# Patient Record
Sex: Female | Born: 1945 | Race: White | Hispanic: No | Marital: Married | Smoking: Never smoker
Health system: Southern US, Community
[De-identification: ages and names within clinical notes are randomized; demographics above are authoritative.]

## PROBLEM LIST (undated history)

## (undated) DIAGNOSIS — F419 Anxiety disorder, unspecified: Secondary | ICD-10-CM

## (undated) DIAGNOSIS — Z853 Personal history of malignant neoplasm of breast: Secondary | ICD-10-CM

## (undated) DIAGNOSIS — N952 Postmenopausal atrophic vaginitis: Secondary | ICD-10-CM

## (undated) DIAGNOSIS — E7800 Pure hypercholesterolemia, unspecified: Secondary | ICD-10-CM

## (undated) DIAGNOSIS — K589 Irritable bowel syndrome without diarrhea: Secondary | ICD-10-CM

## (undated) DIAGNOSIS — L299 Pruritus, unspecified: Secondary | ICD-10-CM

## (undated) DIAGNOSIS — J349 Unspecified disorder of nose and nasal sinuses: Secondary | ICD-10-CM

## (undated) DIAGNOSIS — I10 Essential (primary) hypertension: Secondary | ICD-10-CM

## (undated) DIAGNOSIS — C50919 Malignant neoplasm of unspecified site of unspecified female breast: Secondary | ICD-10-CM

## (undated) DIAGNOSIS — M8580 Other specified disorders of bone density and structure, unspecified site: Secondary | ICD-10-CM

## (undated) DIAGNOSIS — H53009 Unspecified amblyopia, unspecified eye: Secondary | ICD-10-CM

## (undated) DIAGNOSIS — Z803 Family history of malignant neoplasm of breast: Secondary | ICD-10-CM

## (undated) DIAGNOSIS — K219 Gastro-esophageal reflux disease without esophagitis: Secondary | ICD-10-CM

## (undated) DIAGNOSIS — Z808 Family history of malignant neoplasm of other organs or systems: Secondary | ICD-10-CM

## (undated) HISTORY — PX: OTHER SURGICAL HISTORY: SHX169

## (undated) HISTORY — DX: Other specified disorders of bone density and structure, unspecified site: M85.80

## (undated) HISTORY — PX: TONSILLECTOMY: SUR1361

## (undated) HISTORY — DX: Family history of malignant neoplasm of other organs or systems: Z80.8

## (undated) HISTORY — DX: Gastro-esophageal reflux disease without esophagitis: K21.9

## (undated) HISTORY — DX: Malignant neoplasm of unspecified site of unspecified female breast: C50.919

## (undated) HISTORY — PX: BREAST SURGERY: SHX581

## (undated) HISTORY — DX: Unspecified disorder of nose and nasal sinuses: J34.9

## (undated) HISTORY — DX: Anxiety disorder, unspecified: F41.9

## (undated) HISTORY — DX: Essential (primary) hypertension: I10

## (undated) HISTORY — DX: Unspecified amblyopia, unspecified eye: H53.009

## (undated) HISTORY — DX: Family history of malignant neoplasm of breast: Z80.3

## (undated) HISTORY — DX: Pure hypercholesterolemia, unspecified: E78.00

## (undated) HISTORY — DX: Irritable bowel syndrome, unspecified: K58.9

## (undated) HISTORY — DX: Postmenopausal atrophic vaginitis: N95.2

## (undated) HISTORY — PX: VAGINAL HYSTERECTOMY: SUR661

## (undated) HISTORY — PX: EYE SURGERY: SHX253

## (undated) HISTORY — DX: Personal history of malignant neoplasm of breast: Z85.3

## (undated) HISTORY — DX: Pruritus, unspecified: L29.9

---

## 1983-01-11 DIAGNOSIS — Z853 Personal history of malignant neoplasm of breast: Secondary | ICD-10-CM

## 1983-01-11 HISTORY — DX: Personal history of malignant neoplasm of breast: Z85.3

## 1997-07-04 ENCOUNTER — Other Ambulatory Visit: Admission: RE | Admit: 1997-07-04 | Discharge: 1997-07-04 | Payer: Self-pay | Admitting: Obstetrics and Gynecology

## 1999-10-19 ENCOUNTER — Other Ambulatory Visit: Admission: RE | Admit: 1999-10-19 | Discharge: 1999-10-19 | Payer: Self-pay | Admitting: Gynecology

## 2001-03-16 ENCOUNTER — Other Ambulatory Visit: Admission: RE | Admit: 2001-03-16 | Discharge: 2001-03-16 | Payer: Self-pay | Admitting: Gynecology

## 2002-12-31 ENCOUNTER — Other Ambulatory Visit: Admission: RE | Admit: 2002-12-31 | Discharge: 2002-12-31 | Payer: Self-pay | Admitting: Gynecology

## 2004-01-06 ENCOUNTER — Other Ambulatory Visit: Admission: RE | Admit: 2004-01-06 | Discharge: 2004-01-06 | Payer: Self-pay | Admitting: Gynecology

## 2005-03-10 ENCOUNTER — Other Ambulatory Visit: Admission: RE | Admit: 2005-03-10 | Discharge: 2005-03-10 | Payer: Self-pay | Admitting: Gynecology

## 2007-05-31 ENCOUNTER — Other Ambulatory Visit: Admission: RE | Admit: 2007-05-31 | Discharge: 2007-05-31 | Payer: Self-pay | Admitting: Gynecology

## 2007-11-22 ENCOUNTER — Ambulatory Visit: Payer: Self-pay | Admitting: Women's Health

## 2007-11-22 ENCOUNTER — Encounter: Payer: Self-pay | Admitting: Women's Health

## 2007-11-22 ENCOUNTER — Other Ambulatory Visit: Admission: RE | Admit: 2007-11-22 | Discharge: 2007-11-22 | Payer: Self-pay | Admitting: Gynecology

## 2007-12-24 ENCOUNTER — Ambulatory Visit: Payer: Self-pay | Admitting: Women's Health

## 2008-06-13 ENCOUNTER — Encounter: Payer: Self-pay | Admitting: Women's Health

## 2008-06-13 ENCOUNTER — Ambulatory Visit: Payer: Self-pay | Admitting: Women's Health

## 2008-06-13 ENCOUNTER — Other Ambulatory Visit: Admission: RE | Admit: 2008-06-13 | Discharge: 2008-06-13 | Payer: Self-pay | Admitting: Gynecology

## 2008-07-31 ENCOUNTER — Ambulatory Visit: Payer: Self-pay | Admitting: Women's Health

## 2008-10-02 ENCOUNTER — Ambulatory Visit: Payer: Self-pay | Admitting: Women's Health

## 2008-11-07 ENCOUNTER — Ambulatory Visit: Payer: Self-pay | Admitting: Women's Health

## 2009-01-14 ENCOUNTER — Encounter: Admission: RE | Admit: 2009-01-14 | Discharge: 2009-01-14 | Payer: Self-pay | Admitting: Gastroenterology

## 2009-02-23 ENCOUNTER — Ambulatory Visit: Payer: Self-pay | Admitting: Women's Health

## 2009-07-24 ENCOUNTER — Ambulatory Visit: Payer: Self-pay | Admitting: Women's Health

## 2009-09-17 ENCOUNTER — Ambulatory Visit: Payer: Self-pay | Admitting: Women's Health

## 2009-09-17 ENCOUNTER — Other Ambulatory Visit: Admission: RE | Admit: 2009-09-17 | Discharge: 2009-09-17 | Payer: Self-pay | Admitting: Gynecology

## 2009-11-16 ENCOUNTER — Ambulatory Visit: Payer: Self-pay | Admitting: Women's Health

## 2009-11-30 ENCOUNTER — Ambulatory Visit: Payer: Self-pay | Admitting: Women's Health

## 2009-12-01 ENCOUNTER — Ambulatory Visit: Payer: Self-pay | Admitting: Women's Health

## 2010-03-04 ENCOUNTER — Ambulatory Visit (INDEPENDENT_AMBULATORY_CARE_PROVIDER_SITE_OTHER): Payer: BC Managed Care – PPO | Admitting: Women's Health

## 2010-03-04 DIAGNOSIS — B3731 Acute candidiasis of vulva and vagina: Secondary | ICD-10-CM

## 2010-03-04 DIAGNOSIS — N390 Urinary tract infection, site not specified: Secondary | ICD-10-CM

## 2010-03-04 DIAGNOSIS — B373 Candidiasis of vulva and vagina: Secondary | ICD-10-CM

## 2010-03-04 DIAGNOSIS — R82998 Other abnormal findings in urine: Secondary | ICD-10-CM

## 2010-03-31 ENCOUNTER — Ambulatory Visit (INDEPENDENT_AMBULATORY_CARE_PROVIDER_SITE_OTHER): Payer: BC Managed Care – PPO | Admitting: Women's Health

## 2010-03-31 DIAGNOSIS — R350 Frequency of micturition: Secondary | ICD-10-CM

## 2010-03-31 DIAGNOSIS — N898 Other specified noninflammatory disorders of vagina: Secondary | ICD-10-CM

## 2010-03-31 DIAGNOSIS — B373 Candidiasis of vulva and vagina: Secondary | ICD-10-CM

## 2010-03-31 DIAGNOSIS — R823 Hemoglobinuria: Secondary | ICD-10-CM

## 2010-08-23 ENCOUNTER — Other Ambulatory Visit: Payer: Self-pay | Admitting: Women's Health

## 2010-09-21 ENCOUNTER — Encounter: Payer: Self-pay | Admitting: Anesthesiology

## 2010-09-21 DIAGNOSIS — K589 Irritable bowel syndrome without diarrhea: Secondary | ICD-10-CM | POA: Insufficient documentation

## 2010-09-24 ENCOUNTER — Encounter: Payer: Self-pay | Admitting: Women's Health

## 2010-09-24 ENCOUNTER — Ambulatory Visit (INDEPENDENT_AMBULATORY_CARE_PROVIDER_SITE_OTHER): Payer: BC Managed Care – PPO | Admitting: Women's Health

## 2010-09-24 ENCOUNTER — Other Ambulatory Visit (HOSPITAL_COMMUNITY)
Admission: RE | Admit: 2010-09-24 | Discharge: 2010-09-24 | Disposition: A | Discharge status: Home / Self Care | Payer: BC Managed Care – PPO | Source: Ambulatory Visit | Attending: Women's Health | Admitting: Women's Health

## 2010-09-24 VITALS — BP 124/74 | Ht 61.0 in | Wt 131.0 lb

## 2010-09-24 DIAGNOSIS — Z01419 Encounter for gynecological examination (general) (routine) without abnormal findings: Secondary | ICD-10-CM

## 2010-09-24 DIAGNOSIS — Z780 Asymptomatic menopausal state: Secondary | ICD-10-CM

## 2010-09-24 DIAGNOSIS — R82998 Other abnormal findings in urine: Secondary | ICD-10-CM

## 2010-09-24 NOTE — Progress Notes (Signed)
Tracey Norris 1945/10/14 [id number]    History:    The patient presents for annual exam.  Realtor,  problems with head itching, flaking and scalp soreness was seen by a dermatologist 3 times without much relief..   Past medical history, past surgical history, family history and social history were all reviewed and documented in the EPIC chart.   ROS:  A  ROS was performed and pertinent positives and negatives are included in the history.  Exam:  Filed Vitals:   09/24/10 0939  BP: 124/74    General appearance:  Normal Head/Neck:  Normal, without cervical or supraclavicular adenopathy. Thyroid:  Symmetrical, normal in size, without palpable masses or nodularity. Respiratory  Effort:  Normal  Auscultation:  Clear without wheezing or rhonchi Cardiovascular  Auscultation:  Regular rate, without rubs, murmurs or gallops  Edema/varicosities:  Not grossly evident Abdominal  Soft,nontender, without masses, guarding or rebound.  Liver/spleen:  No organomegaly noted  Hernia:  None appreciated  Skin  Inspection:  Grossly normal  Palpation:  Grossly normal Neurologic/psychiatric  Orientation:  Normal with appropriate conversation.  Mood/affect:  Normal  Genitourinary    Breasts: Examined lying and sitting.     Right: Reduction Without masses, retractions, discharge or axillary adenopathy.     Left: Mastectomy/implant   Inguinal/mons:  Normal without inguinal adenopathy  External genitalia:  Normal  BUS/Urethra/Skene's glands:  Normal  Bladder:  Normal  Vagina:  Normal  Cervix:  absent  Uterus:  absent  Adnexa/parametria:     Rt: Without masses or tenderness.   Lt: Without masses or tenderness.  Anus and perineum: Normal  Digital rectal exam: Normal sphincter tone without palpated masses or tenderness  Assessment/Plan:  65 y.o.MWF G2P2  for annual exam/ TVH for irregular menses and menorrhagia.  History of the left mastectomy (85) with reconstruction(07). History of  hypertension, anxiety, primary care does medications and labs. She had a normal bone density in 08, and normal colonoscopy in 2011. She has had zostovac vaccine.  Plan: UA, Pap, SBEs, yearly mammogram, exercise, calcium rich diet, vitamin D 1000 daily, continue using mild shampoo, followup with dermatologist for scalp issue as needed. Schedule a DEXA here in the office. Fall prevention reviewed, will get a flu vaccine at her primary care. Has had problems with recurrent UTIs and yeast, but has done better this past year.    [facility], 12:57 PM 09/24/2010

## 2011-01-07 ENCOUNTER — Other Ambulatory Visit: Payer: Self-pay | Admitting: *Deleted

## 2011-01-07 DIAGNOSIS — Z853 Personal history of malignant neoplasm of breast: Secondary | ICD-10-CM

## 2011-01-21 ENCOUNTER — Other Ambulatory Visit: Payer: Self-pay | Admitting: Women's Health

## 2011-01-21 ENCOUNTER — Encounter: Payer: Self-pay | Admitting: Women's Health

## 2011-01-21 ENCOUNTER — Ambulatory Visit (INDEPENDENT_AMBULATORY_CARE_PROVIDER_SITE_OTHER): Payer: BC Managed Care – PPO | Admitting: Women's Health

## 2011-01-21 DIAGNOSIS — M545 Low back pain: Secondary | ICD-10-CM

## 2011-01-21 DIAGNOSIS — N899 Noninflammatory disorder of vagina, unspecified: Secondary | ICD-10-CM

## 2011-01-21 DIAGNOSIS — N898 Other specified noninflammatory disorders of vagina: Secondary | ICD-10-CM

## 2011-01-21 LAB — URINALYSIS W MICROSCOPIC + REFLEX CULTURE
Crystals: NONE SEEN
Glucose, UA: NEGATIVE mg/dL
Nitrite: NEGATIVE
Specific Gravity, Urine: 1.01 (ref 1.005–1.030)

## 2011-01-21 LAB — WET PREP FOR TRICH, YEAST, CLUE
Clue Cells Wet Prep HPF POC: NONE SEEN
Yeast Wet Prep HPF POC: NONE SEEN

## 2011-01-21 NOTE — Progress Notes (Signed)
Patient ID: Tracey Norris, female   DOB: 1945/01/15, 66 y.o.   MRN: [id number] Presents with a complaint of bladder pressure and urgency. Has been on an antibiotic for a skin issue and questions if she has yeast. Has had problems with recurrent yeast and UTIs. Denies fever, pain or burning with urination. History of a TVH in 54. UA: Small leukocytes, 3-6 WBCs.  Exam: No CVAT, abdomen soft nontender, slight tenderness at suprapubic area. External genitalia within normal limits without erythema. Speculum exam vaginal walls pink without discharge or odor. Wet prep negative. Bimanual no adnexal fullness or tenderness.   Probable UTI  Plan: Urine culture pending. Cipro 250 by mouth twice a day for 7 days prescription given. Will take if symptoms increase or change over the weekend, will call for culture results on Monday. Aware of UTI and yeast prevention.

## 2011-01-23 LAB — URINE CULTURE: Colony Count: 75000

## 2011-01-24 ENCOUNTER — Telehealth: Payer: Self-pay | Admitting: Women's Health

## 2011-01-24 DIAGNOSIS — N390 Urinary tract infection, site not specified: Secondary | ICD-10-CM

## 2011-01-24 NOTE — Telephone Encounter (Signed)
Telephone call, informed urine culture positive for Lactobacillus. Reviewed it is not a common finding in urine cultures. Taking large amounts of probiotics for IBS symptoms. Did review decreasing the amount of probiotics. States does feel like she has a UTI. Will take Cipro 250 twice a day for 3 days. Instructed to call if still symptomatic and will check a test of cure in 2 weeks.Marland Kitchen

## 2011-01-26 ENCOUNTER — Encounter: Payer: Self-pay | Admitting: *Deleted

## 2011-01-26 NOTE — Progress Notes (Signed)
Patient ID: Tracey Norris, female   DOB: 1945-09-04, 66 y.o.   MRN: [id number] Pt called and left message about urine culture, spoke with pt and said that she spoke with nancy already,

## 2011-01-28 ENCOUNTER — Other Ambulatory Visit: Payer: Self-pay | Admitting: Women's Health

## 2011-01-28 DIAGNOSIS — Z853 Personal history of malignant neoplasm of breast: Secondary | ICD-10-CM

## 2011-02-03 ENCOUNTER — Telehealth: Payer: Self-pay | Admitting: *Deleted

## 2011-02-03 NOTE — Telephone Encounter (Signed)
Telephone call to review problem. Took 3 days of Cipro, urine culture was negative. Has had problems with recurrent UTIs and yeast. States has slight leg aching and backache. Will watch at this time and return to office in several days if no relief for a UA and wet prep. Denies fever.

## 2011-02-03 NOTE — Telephone Encounter (Signed)
Pt was seen in office on 01/21/11 back pain and leg cramps. She was given cipro 250 mg 7 day and told to take only half of the 7 day dose. Pt did so, now she feel sick again, feeling nausea, having leg cramps again. She mentioned that you did want to recheck a U/A once medication finish. Pt would like to know if you want her to come in to recheck for yeast or drop U/A off? Please advise

## 2011-02-07 ENCOUNTER — Ambulatory Visit (INDEPENDENT_AMBULATORY_CARE_PROVIDER_SITE_OTHER): Payer: BC Managed Care – PPO | Admitting: Women's Health

## 2011-02-07 ENCOUNTER — Encounter: Payer: Self-pay | Admitting: Women's Health

## 2011-02-07 DIAGNOSIS — I10 Essential (primary) hypertension: Secondary | ICD-10-CM | POA: Insufficient documentation

## 2011-02-07 DIAGNOSIS — N898 Other specified noninflammatory disorders of vagina: Secondary | ICD-10-CM

## 2011-02-07 DIAGNOSIS — R300 Dysuria: Secondary | ICD-10-CM

## 2011-02-07 LAB — URINALYSIS W MICROSCOPIC + REFLEX CULTURE
Bilirubin Urine: NEGATIVE
Crystals: NONE SEEN
Glucose, UA: NEGATIVE mg/dL
Nitrite: NEGATIVE
Protein, ur: NEGATIVE mg/dL

## 2011-02-07 LAB — WET PREP FOR TRICH, YEAST, CLUE
Clue Cells Wet Prep HPF POC: NONE SEEN
Trich, Wet Prep: NONE SEEN

## 2011-02-07 NOTE — Progress Notes (Signed)
Patient ID: Tracey Norris, female   DOB: [birth year], 66 y.o.   MRN: [id number] Presents with complaint of low back ache, achy sensation in her legs and abdomen, frequency, scant clear discharge. States has similar symptoms when she has had a UTI or yeast infection. History of recurrent UTIs and yeast. Denies any fever. Had a negative urine culture 2 weeks ago. History of a TVH.  Exam: CVAT, abdomen was soft with no rebound or radiation of pain or tenderness in the suprapubic area. External genitalia is slightly erythematous. Speculum exam scant erythema, scant discharge no odor was present. Wet prep completely negative. Bimanual no adnexal fullness or tenderness. UA 0-2 WBCs.  When: Urine culture pending, triage based on the results. Aware of yeast prevention. Has Diflucan 100 at home willl try 1 tablet. Tylenol when necessary for discomfort.

## 2011-02-09 LAB — URINE CULTURE
Colony Count: NO GROWTH
Organism ID, Bacteria: NO GROWTH

## 2011-04-05 ENCOUNTER — Encounter: Payer: Self-pay | Admitting: Gynecology

## 2011-04-05 ENCOUNTER — Ambulatory Visit (INDEPENDENT_AMBULATORY_CARE_PROVIDER_SITE_OTHER): Payer: BC Managed Care – PPO | Admitting: Gynecology

## 2011-04-05 ENCOUNTER — Other Ambulatory Visit: Payer: Self-pay | Admitting: Gynecology

## 2011-04-05 ENCOUNTER — Telehealth: Payer: Self-pay | Admitting: *Deleted

## 2011-04-05 VITALS — BP 146/88

## 2011-04-05 DIAGNOSIS — N952 Postmenopausal atrophic vaginitis: Secondary | ICD-10-CM

## 2011-04-05 DIAGNOSIS — R350 Frequency of micturition: Secondary | ICD-10-CM

## 2011-04-05 DIAGNOSIS — N898 Other specified noninflammatory disorders of vagina: Secondary | ICD-10-CM

## 2011-04-05 LAB — URINALYSIS W MICROSCOPIC + REFLEX CULTURE
Bilirubin Urine: NEGATIVE
Crystals: NONE SEEN
Ketones, ur: NEGATIVE mg/dL

## 2011-04-05 LAB — WET PREP FOR TRICH, YEAST, CLUE
Trich, Wet Prep: NONE SEEN
Yeast Wet Prep HPF POC: NONE SEEN

## 2011-04-05 NOTE — Telephone Encounter (Signed)
Per JF  pt will need OV, pt transferred to appointment desk to schedule.

## 2011-04-05 NOTE — Progress Notes (Signed)
Patient is a 66 year old who presented to the office today stating that she's going frequently to urinate but feel that she does not empty completely. She denies any dysuria some slight irritation in the vagina and that her urine had an odor. Review patient's records indicated that she has been treated in the past for urinary tract infections. She is on Vagifem 10 mcg intravaginal twice a week. She had been counseled before this was initiated due to the fact that in 1985 she had left breast cancer. Patient denies any fever chills.  Exam: Bartholin urethra Skene glands: Atrophic changes Vagina: Atrophic changes and no vaginal discharge or lesions seen Bimanual: No palpable masses or tenderness Rectal: Not examined  Vaginal wet prep today CBC few bacteria  Urinalysis 3-6 WBC few bacteria culture pending  Assessment/plan: Due to patient's past history and similar symptoms similar to prior urinary tract infection we'll initiate antibiotic prior to result of her culture. She will be placed on Cipro 250 mg twice a day for 5 days. She was instructed to increase her fluid intake and to report to the office or the emergency room in the event of flank pain and/or fever. If this continues to be an ongoing issue she may need to be referred to the urologist for further evaluation.

## 2011-04-05 NOTE — Telephone Encounter (Signed)
Pt called c/o possible UTI. symptoms are aching legs and strong urine smell, frequent urination  x 2 days. Pt said this is normal for her to have recurrent UTI. No fever nor other complaints. Pt would like to drop off u/a, she cant make OV due to work. Please advise

## 2011-04-05 NOTE — Patient Instructions (Signed)
Prescription in pharmacy. You can call Monday for result of culture. Office closed Friday  Urinary Tract Infection Infections of the urinary tract can start in several places. A bladder infection (cystitis), a kidney infection (pyelonephritis), and a prostate infection (prostatitis) are different types of urinary tract infections (UTIs). They usually get better if treated with medicines (antibiotics) that kill germs. Take all the medicine until it is gone. You or your child may feel better in a few days, but TAKE ALL MEDICINE or the infection may not respond and may become more difficult to treat. HOME CARE INSTRUCTIONS   Drink enough water and fluids to keep the urine clear or pale yellow. Cranberry juice is especially recommended, in addition to large amounts of water.   Avoid caffeine, tea, and carbonated beverages. They tend to irritate the bladder.   Alcohol may irritate the prostate.   Only take over-the-counter or prescription medicines for pain, discomfort, or fever as directed by your caregiver.  To prevent further infections:  Empty the bladder often. Avoid holding urine for long periods of time.   After a bowel movement, women should cleanse from front to back. Use each tissue only once.   Empty the bladder before and after sexual intercourse.  FINDING OUT THE RESULTS OF YOUR TEST Not all test results are available during your visit. If your or your child's test results are not back during the visit, make an appointment with your caregiver to find out the results. Do not assume everything is normal if you have not heard from your caregiver or the medical facility. It is important for you to follow up on all test results. SEEK MEDICAL CARE IF:   There is back pain.   Your baby is older than 3 months with a rectal temperature of 100.5 F (38.1 C) or higher for more than 1 day.   Your or your child's problems (symptoms) are no better in 3 days. Return sooner if you or your child  is getting worse.  SEEK IMMEDIATE MEDICAL CARE IF:   There is severe back pain or lower abdominal pain.   You or your child develops chills.   You have a fever.   Your baby is older than 3 months with a rectal temperature of 102 F (38.9 C) or higher.   Your baby is 63 months old or younger with a rectal temperature of 100.4 F (38 C) or higher.   There is nausea or vomiting.   There is continued burning or discomfort with urination.  MAKE SURE YOU:   Understand these instructions.   Will watch your condition.   Will get help right away if you are not doing well or get worse.  Document Released: 10/06/2004 Document Revised: 12/16/2010 Document Reviewed: 05/11/2006 [facility] Patient Information 2012 [place], [place].

## 2011-04-06 ENCOUNTER — Telehealth: Payer: Self-pay | Admitting: *Deleted

## 2011-04-06 MED ORDER — CIPROFLOXACIN HCL 250 MG PO TABS: 250.0000 mg | ORAL_TABLET | Freq: Two times a day (BID) | ORAL | Status: AC

## 2011-04-06 NOTE — Telephone Encounter (Signed)
Pt called stating pharmacy never got rx for cipro 250 mg twice a day for 5 days. rx sent to pharmacy. Pt informed.

## 2011-04-07 ENCOUNTER — Telehealth: Payer: Self-pay | Admitting: *Deleted

## 2011-04-07 LAB — URINE CULTURE: Organism ID, Bacteria: NO GROWTH

## 2011-04-07 NOTE — Telephone Encounter (Signed)
Pt saw JF for uti on 04/05/11. Pt would like to speak with you about her urine culture results. Please advise

## 2011-04-07 NOTE — Telephone Encounter (Signed)
Telephone call reviewed urine culture negative. Will finish out 3 days of cipro, symptoms better.

## 2011-04-27 ENCOUNTER — Ambulatory Visit (INDEPENDENT_AMBULATORY_CARE_PROVIDER_SITE_OTHER): Payer: BC Managed Care – PPO | Admitting: Women's Health

## 2011-04-27 ENCOUNTER — Encounter: Payer: Self-pay | Admitting: Women's Health

## 2011-04-27 DIAGNOSIS — C50919 Malignant neoplasm of unspecified site of unspecified female breast: Secondary | ICD-10-CM

## 2011-04-27 DIAGNOSIS — N898 Other specified noninflammatory disorders of vagina: Secondary | ICD-10-CM

## 2011-04-27 DIAGNOSIS — C50912 Malignant neoplasm of unspecified site of left female breast: Secondary | ICD-10-CM | POA: Insufficient documentation

## 2011-04-27 DIAGNOSIS — L293 Anogenital pruritus, unspecified: Secondary | ICD-10-CM

## 2011-04-27 DIAGNOSIS — R300 Dysuria: Secondary | ICD-10-CM

## 2011-04-27 LAB — URINALYSIS W MICROSCOPIC + REFLEX CULTURE
Casts: NONE SEEN
Crystals: NONE SEEN
Glucose, UA: NEGATIVE mg/dL
Ketones, ur: NEGATIVE mg/dL
Nitrite: NEGATIVE

## 2011-04-27 LAB — WET PREP FOR TRICH, YEAST, CLUE: Trich, Wet Prep: NONE SEEN

## 2011-04-27 NOTE — Progress Notes (Signed)
Patient ID: Akshaya Toepfer, female   DOB: [birth year], 66 y.o.   MRN: [id number] Presents with slight vaginal burning with occasional itching and bladder pressure for several weeks. Denies a fever, discharge, change in urinary frequency or pain at the end of the stream. TVH.  Has had problems with increased anxiety partially related to scalp burning/discomfort which has been a problem for almost one year. Has followup today with dermatologist.  Exam: No CVAT, abdomen soft, no rebound or radiation of pain. External genitalia slight erythema at introitus, speculum exam scant white discharge, wet prep negative, UA trace leukocytes, 0 to 2 WBCs.  Vaginal burning/bladder pressure History of recurrent yeast  Plan: Urine culture pending, if urine culture negative, bladder pressure continues, ultrasound to rule out ovarian issue. Diflucan 200 by mouth x1 dose.

## 2011-07-11 IMAGING — US US ABDOMEN COMPLETE
1 series · 13 of 25 positions shown · non-contrast
Comparison: None.

CLINICAL DATA: Abdominal pain, nausea, some diarrhea

COMPLETE ABDOMINAL ULTRASOUND

[Series 1: us abdomen complete · 0.31mm/px · 13 of 65 slices shown]
[im 1/65]
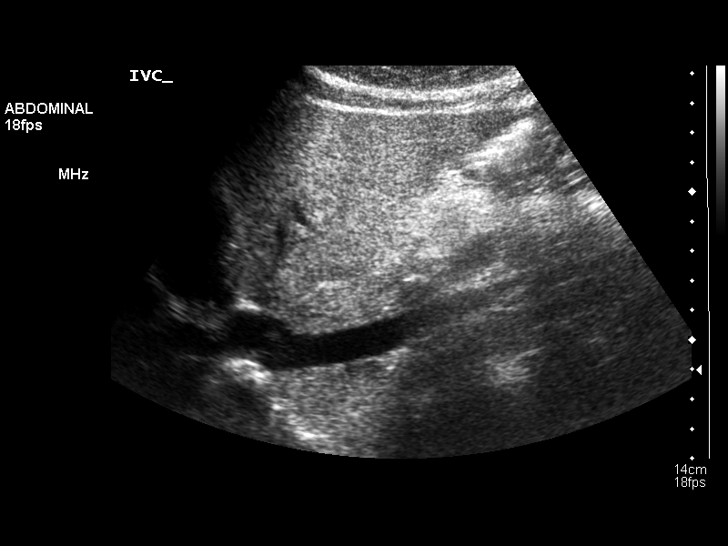
[im 6/65]
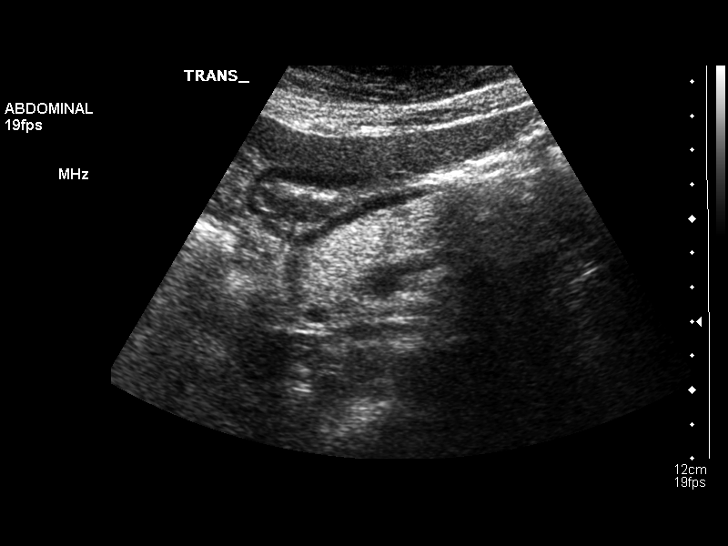
[im 11/65]
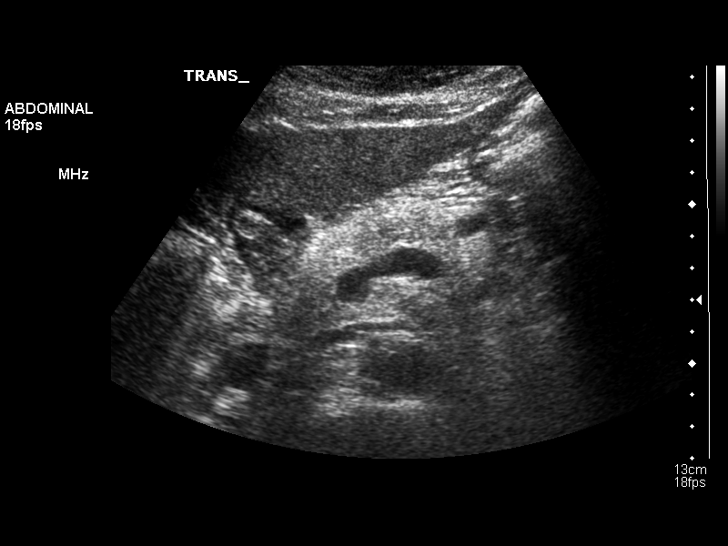
[im 17/65]
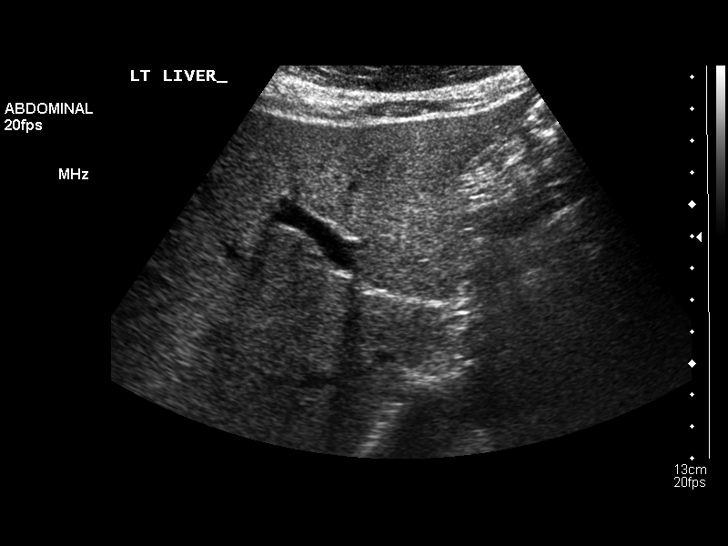
[im 22/65]
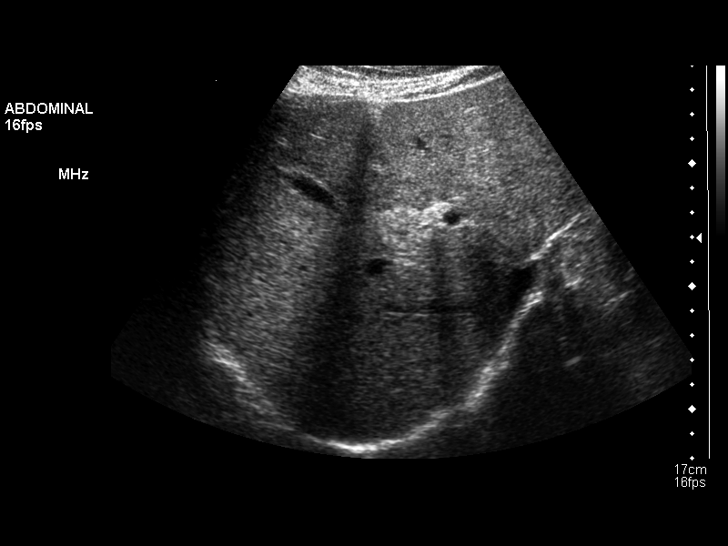
[im 27/65]
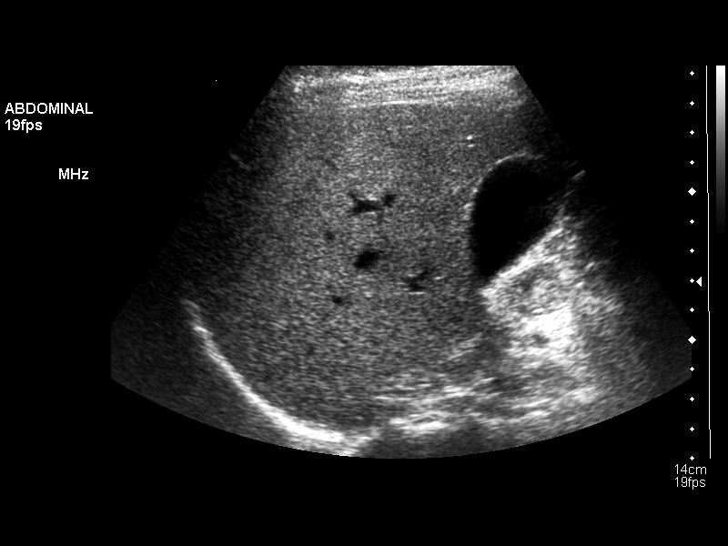
[im 33/65]
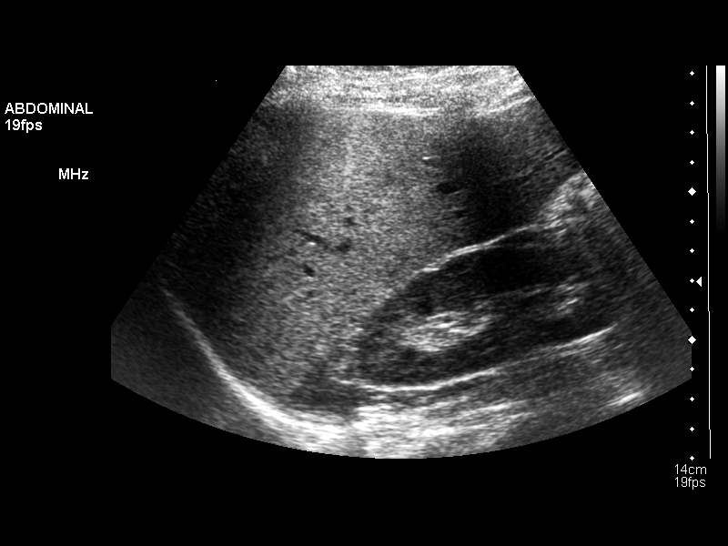
[im 38/65]
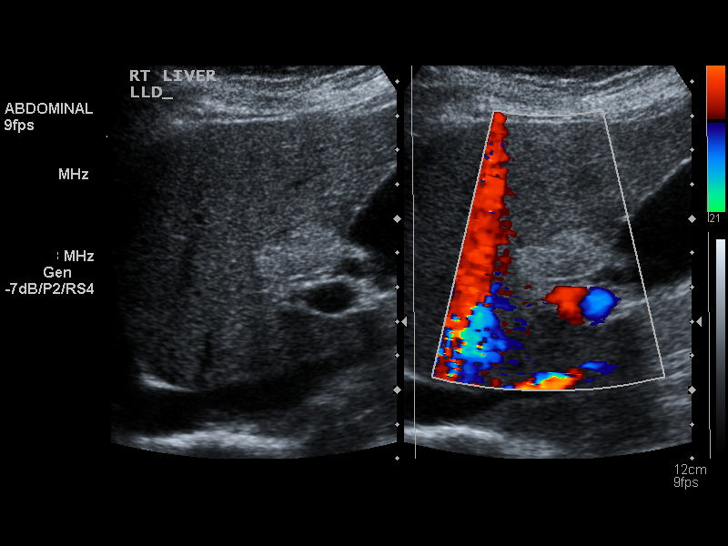
[im 43/65]
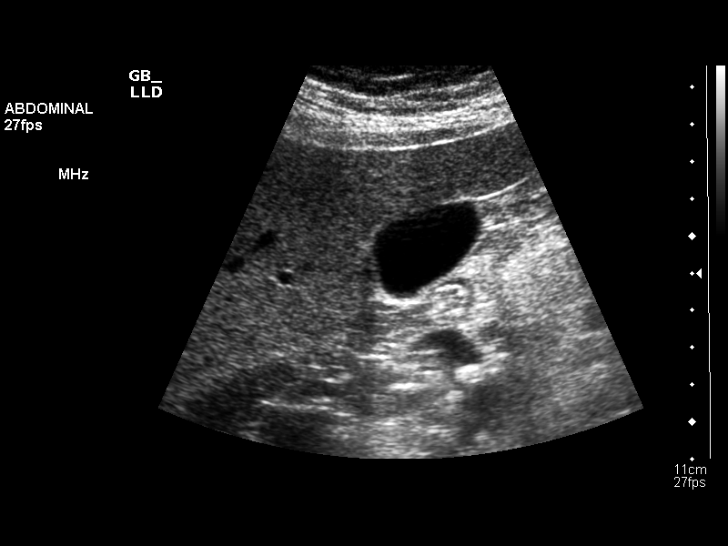
[im 49/65]
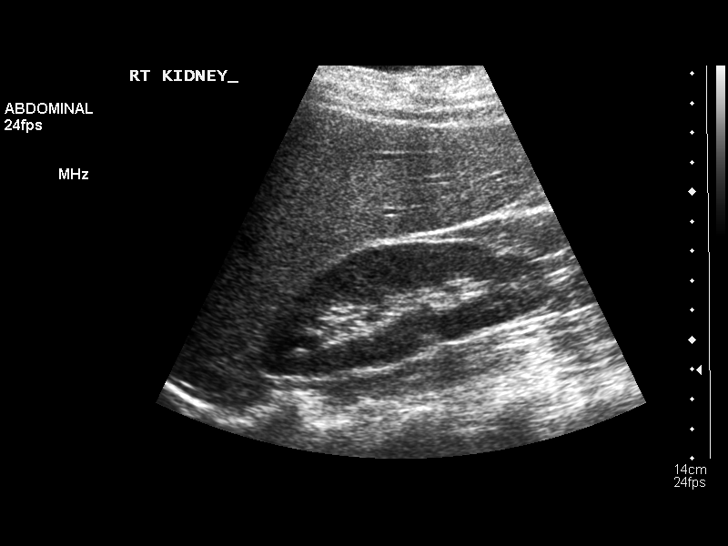
[im 54/65]
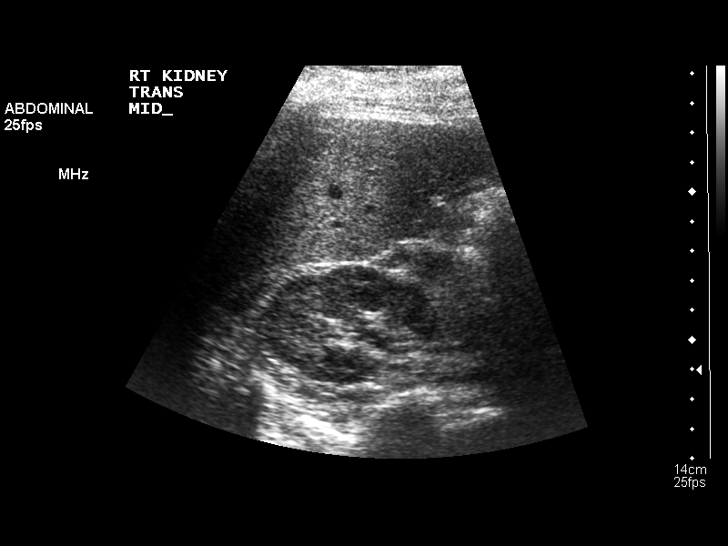
[im 59/65]
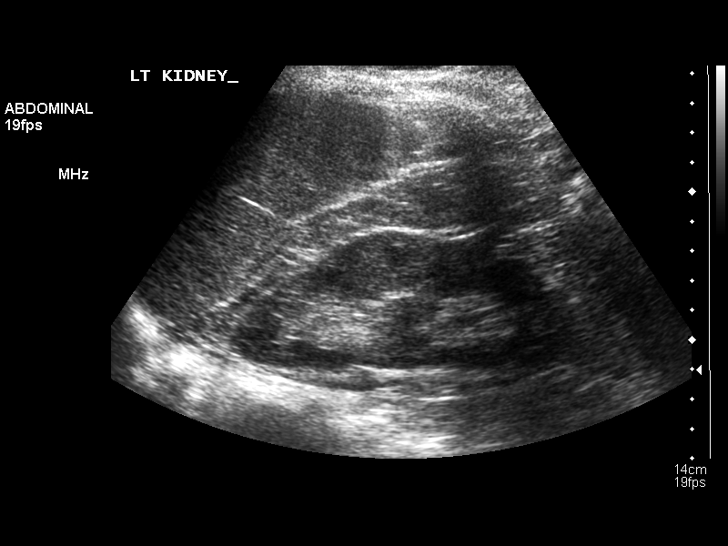
[im 65/65]
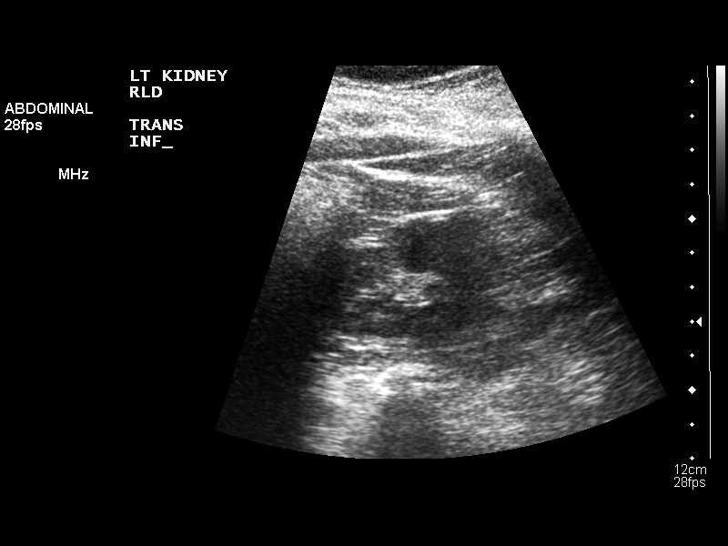

[13 of 25 positions shown; findings below may reference images not displayed]

FINDINGS: Gallbladder:  The gallbladder is well seen and no gallstones are
noted

Common bile duct:  The common bile duct is within normal limits in
size measuring 5.8 mm in diameter.

Liver:  The liver is slightly echogenic suggesting fatty
infiltration.  However, there is a focal area of hyper echogenicity
near the porta hepatis.  This could represent focal fat or possibly
hemangioma measuring 2.7 x 1.4 x 2.6 cm.  Either follow-up
ultrasound or CT abdomen with IV contrast may be helpful to assess
further if warranted clinically.

IVC:  Appears normal.

Pancreas:  No focal abnormality seen.

Spleen:  The spleen is normal measuring 6.5 cm sagittally with
small accessory spleen present.

Right Kidney:  No hydronephrosis is seen.  The right kidney
measures 10.0 cm sagittally.

Left Kidney:  No hydronephrosis.  The left kidney measures 12.1 cm
pre

Abdominal aorta:  The abdominal aorta is normal in caliber.
IMPRESSION: 1.  Probable fatty infiltration of the liver with focal fat versus
hemangioma near the porta hepatis as described above.  If further
assessment is warranted consider either follow-up ultrasound or CT
abdomen with contrast.
2.  No gallstones.  No ductal dilatation.

## 2011-08-02 ENCOUNTER — Telehealth: Payer: Self-pay | Admitting: *Deleted

## 2011-08-02 MED ORDER — ESTRADIOL 10 MCG VA TABS: 10.0000 ug | ORAL_TABLET | VAGINAL | Status: DC

## 2011-08-02 NOTE — Telephone Encounter (Signed)
Pt called requesting rx for vagifem 10 mcg. Pt old rx had expired, rx sent.

## 2011-09-07 ENCOUNTER — Ambulatory Visit (INDEPENDENT_AMBULATORY_CARE_PROVIDER_SITE_OTHER): Payer: BC Managed Care – PPO | Admitting: Women's Health

## 2011-09-07 ENCOUNTER — Encounter: Payer: Self-pay | Admitting: Women's Health

## 2011-09-07 VITALS — BP 130/82

## 2011-09-07 DIAGNOSIS — R300 Dysuria: Secondary | ICD-10-CM

## 2011-09-07 DIAGNOSIS — N898 Other specified noninflammatory disorders of vagina: Secondary | ICD-10-CM

## 2011-09-07 DIAGNOSIS — L293 Anogenital pruritus, unspecified: Secondary | ICD-10-CM

## 2011-09-07 DIAGNOSIS — N390 Urinary tract infection, site not specified: Secondary | ICD-10-CM

## 2011-09-07 LAB — URINALYSIS W MICROSCOPIC + REFLEX CULTURE
Bilirubin Urine: NEGATIVE
Crystals: NONE SEEN
Glucose, UA: NEGATIVE mg/dL
Ketones, ur: NEGATIVE mg/dL
Nitrite: NEGATIVE

## 2011-09-07 LAB — WET PREP FOR TRICH, YEAST, CLUE
Clue Cells Wet Prep HPF POC: NONE SEEN
Trich, Wet Prep: NONE SEEN
Yeast Wet Prep HPF POC: NONE SEEN

## 2011-09-07 MED ORDER — CIPROFLOXACIN HCL 500 MG PO TABS: 500.0000 mg | ORAL_TABLET | Freq: Two times a day (BID) | ORAL | Status: DC

## 2011-09-07 MED ORDER — CIPROFLOXACIN HCL 500 MG PO TABS: 500.0000 mg | ORAL_TABLET | Freq: Two times a day (BID) | ORAL | Status: AC

## 2011-09-07 NOTE — Progress Notes (Signed)
Patient ID: Tracey Norris, female   DOB: March 13, 1945, 66 y.o.   MRN: [id number] Presents with several complaints, having some left lateral hip discomfort, occasional vaginal burning, increased frequency and pressure with urination. Currently on probiotics which have helped prevent yeast. Denies a fever, but states has not felt well for several days. Has had problems with recurrent yeast and UTIs.  Exam: UA: 11-20 WBCs, few bacteria, small leukocytes. No CVAT, abdomen soft no rebound or radiation of pain, external genitalia within normal limits, speculum exam cervix pink, no discharge or erythema noted. Wet prep negative. Bimanual no CMT or adnexal fullness or tenderness.  UTI  Plan: Cipro 500 by mouth twice a day for 3 days. Urine culture pending. Continue with Vagifem twice weekly for vaginal dryness.

## 2011-09-07 NOTE — Patient Instructions (Addendum)

## 2011-09-09 LAB — URINE CULTURE

## 2011-09-26 ENCOUNTER — Encounter: Payer: Self-pay | Admitting: Women's Health

## 2011-09-26 ENCOUNTER — Ambulatory Visit (INDEPENDENT_AMBULATORY_CARE_PROVIDER_SITE_OTHER): Payer: BC Managed Care – PPO | Admitting: Women's Health

## 2011-09-26 VITALS — BP 126/82 | Ht 62.0 in | Wt 133.0 lb

## 2011-09-26 DIAGNOSIS — Z780 Asymptomatic menopausal state: Secondary | ICD-10-CM

## 2011-09-26 DIAGNOSIS — G4700 Insomnia, unspecified: Secondary | ICD-10-CM

## 2011-09-26 DIAGNOSIS — E7800 Pure hypercholesterolemia, unspecified: Secondary | ICD-10-CM

## 2011-09-26 DIAGNOSIS — Z01419 Encounter for gynecological examination (general) (routine) without abnormal findings: Secondary | ICD-10-CM

## 2011-09-26 MED ORDER — ALPRAZOLAM 0.25 MG PO TABS: 0.2500 mg | ORAL_TABLET | Freq: Every evening | ORAL | Status: DC | PRN

## 2011-09-26 NOTE — Patient Instructions (Signed)
heaHealth Recommendations for Postmenopausal Women Based on the Results of the Women's Health Initiative [facility]) and Other Studies The WHI is a major 15-year research program to address the most common causes of death, disability and poor quality of life in postmenopausal women. Some of these causes are heart disease, cancer, bone loss (osteoporosis) and others. Taking into account all of the findings from [facility] and other studies, here are bottom-line health recommendations for women: CARDIOVASCULAR DISEASE Heart Disease: A heart attack is a medical emergency. Know the signs and symptoms of a heart attack. Hormone therapy should not be used to prevent heart disease. In women with heart disease, hormone therapy should not be used to prevent further disease. Hormone therapy increases the risk of blood clots. Below are things women can do to reduce their risk for heart disease.   Do not smoke. If you smoke, quit. Women who smoke are 2 to 6 times more likely to suffer a heart attack than non-smoking women.   Aim for a healthy weight. Being overweight causes many preventable deaths. Eat a healthy and balanced diet and drink an adequate amount of liquids.   Get moving. Make a commitment to be more physically active. Aim for 30 minutes of activity on most, if not all days of the week.   Eat for heart health. Choose a diet that is low in saturated fat, trans fat, and cholesterol. Include whole grains, vegetables, and fruits. Read the labels on the food container before buying it.   Know your numbers. Ask your caregiver to check your blood pressure, cholesterol (total, HDL, LDL, triglycerides) and blood glucose. Work with your caregiver to improve any numbers that are not normal.   High blood pressure. Limit or stop your table salt intake (try salt substitute and food seasonings), avoid salty foods and drinks. Read the labels on the food container before buying it. Avoid becoming overweight by eating well and  exercising.  STROKE  Stroke is a medical emergency. Stroke can be the result of a blood clot in the blood vessel in the brain or by a brain hemorrhage (bleeding). Know the signs and symptoms of a stroke. To lower the risk of developing a stroke:  Avoid fatty foods.   Quit smoking.   Control your diabetes, blood pressure, and irregular heart rate.  THROMBOPHLIBITIS (BLOOD CLOT) OF THE LEG  Hormone treatment is a big cause of developing blood clots in the leg. Becoming overweight and leading a stationary lifestyle also may contribute to developing blood clots. Controlling your diet and exercising will help lower the risk of developing blood clots. CANCER SCREENING  Breast Cancer: Women should take steps to reduce their risk of breast cancer. This includes having regular mammograms, monthly self breast exams and regular breast exams by your caregiver. Have a mammogram every one to two years if you are 42 to 64 years old. Have a mammogram annually if you are 19 years old or older depending on your risk factors. Women who are high risk for breast cancer may need more frequent mammograms. There are tests available (testing the genes in your body) if you have family history of breast cancer called BRCA 1 and 2. These tests can help determine the risks of developing breast cancer.   Intestinal or Stomach Cancer: Women should talk to their caregiver about when to start screening, what tests and how often they should be done, and the benefits and risks of doing these tests. Tests to consider are a rectal exam, fecal  occult blood, sigmoidoscopy, colononoscoby, barium enema and upper GI series of the stomach. Depending on the age, you may want to get a medical and family history of colon cancer. Women who are high risk may need to be screened at an earlier age and more often.   Cervical Cancer: A Pap test of the cervix should be done every year and every 3 years when there has been three straight years of a  normal Pap test. Women with an abnormal Pap test should be screened more often or have a cervical biopsy depending on your caregiver's recommendation.   Uterine Cancer: If you have vaginal bleeding after you are in the menopause, it should be evaluated by your caregiver.   Ovarian cancer: There are no reliable tests available to screen for ovarian cancer at this time except for yearly pelvic exams.   Lung Cancer: Yearly chest X-rays can detect lung cancer and should be done on high risk women, such as cigarette smokers and women with chronic lung disease (emphysemia).   Skin Cancer: A complete body skin exam should be done at your yearly examination. Avoid overexposure to the sun and ultraviolet light lamps. Use a strong sun block cream when in the sun. All of these things are important in lowering the risk of skin cancer.  MENOPAUSE Menopause Symptoms: Hormone therapy products are effective for treating symptoms associated with menopause:  Moderate to severe hot flashes.   Night sweats.   Mood swings.   Headaches.   Tiredness.   Loss of sex drive.   Insomnia.   Other symptoms.  However, hormone therapy products carry serious risks, especially in older women. Women who use or are thinking about using estrogen or estrogen with progestin treatments should discuss that with their caregiver. Your caregiver will know if the benefits outweigh the risks. The Food and Drug Administration (FDA) has concluded that hormone therapy should be used only at the lowest doses and for the shortest amount of time to reach treatment goals. It is not known at what doses there may be less risk of serious side effects. There are other treatments such as herbal medication (not controlled or regulated by the FDA), group therapy, counseling and acupuncture that may be helpful. OSTEOPOROSIS Protecting Against Bone Loss and Preventing Fracture: If hormone therapy is used for prevention of bone loss (osteoporosis),  the risks for bone loss must outweigh the risk of the therapy. Women considering taking hormone therapy for bone loss should ask their health care providers about other medications (fosamax and boniva) that are considered safe and effective for preventing bone loss and bone fractures. To guard against bone loss or fractures, it is recommended that women should take at least 1000-1500 mg of calcium and 400-800 IU of vitamin D daily in divided doses. Smoking and excessive alcohol intake increases the risk of osteoporosis. Eat foods rich in calcium and vitamin D and do weight bearing exercises several times a week as your caregiver suggests. DIABETES Diabetes Melitus: Women with Type I or Type 2 diabetes should keep their diabetes in control with diet, exercise and medication. Avoid too many sweets, starchy and fatty foods. Being overweight can affect your diabetes. COGNITION AND MEMORY Cognition and Memory: Menopausal hormone therapy is not recommended for the prevention of cognitive disorders such as Alzheimer's disease or memory loss. WHI found that women treated with hormone therapy have a greater risk of developing dementia.  DEPRESSION  Depression may occur at any age, but is common in elderly women.   The reasons may be because of physical, medical, social (loneliness), financial and/or economic problems and needs. Becoming involved with church, volunteer or social groups, seeking treatment for any physical or medical problems is recommended. Also, look into getting professional advice for any economic or financial problems. ACCIDENTS  Accidents are common and can be serious in the elderly woman. Prepare your house to prevent accidents. Eliminate throw rugs, use hip protectors, place hand bars in the bath, shower and toilet areas. Avoid wearing high heel shoes and walking on wet, snowy and icy areas. Stop driving if you have vision, hearing problems or are unsteady with you movements and  reflexes. RHEUMATOID ARTHRITIS Rheumatoid arthritis causes pain, swelling and stiffness of your bone joints. It can limit many of your activities. Over-the-counter medications may help, but prescription medications may be necessary. Talk with your caregiver about this. Exercise (walking, water aerobics), good posture, using splints on painful joints, warm baths or applying warm compresses to stiff joints and cold compresses to painful joints may be helpful. Smoking and excessive drinking may worsen the symptoms of arthritis. Seek help from a physical therapist if the arthritis is becoming a problem with your daily activities. IMMUNIZATIONS  Several immunizations are important to have during your senior years, including:   Tetanus and a diptheria shot booster every 10 years.   Influenza every year before the flu season begins.   Pneumonia vaccine.   Shingles vaccine.   Others as indicated (example: H1N1 vaccine).  Document Released: 02/18/2005 Document Revised: 12/16/2010 Document Reviewed: 10/15/2007 [employer] Patient Information 2012 [employer]. 

## 2011-09-26 NOTE — Progress Notes (Addendum)
Tracey Norris 12-30-1945 [id number]   History:    The patient presents for annual exam.  Postmenopausal, TVH with ovarian conservation for DUB. Uses Vagifem twice weekly for vaginal dryness and itching. Has been on several years, approved by oncologist. Left breast cancer/ mastectomy with node dissection (negative) in 85, reconstructed with implant 07. Did not receive radiation, chemotherapy or tamoxifen.  Breast cancer was not estrogen dependent, has not had BRCA testing and is not sure if she wants to have done. History of hypertension, IBS and hypercholesterolemia treated by primary care. Has had an ongoing problem with a scalp issue, has seen numerous dermatologists. Has had problems with recurrent yeast and UTIs, better in the past year. Normal colonoscopy 2012, normal Paps and normal right breast mammograms. Osteopenic - Dexa right hip T score -1.2  2008.   Past medical history, past surgical history, family history and social history were all reviewed and documented in the EPIC chart. 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 Mother had history of diabetes and breast cancer at age 14, died at 69 heart related.   ROS:  A  ROS was performed and pertinent positives and negatives are included in the history.  Exam:  Filed Vitals:   09/26/11 1159  BP: 126/82    General appearance:  Normal Head/Neck:  Normal, without cervical or supraclavicular adenopathy. Thyroid:  Symmetrical, normal in size, without palpable masses or nodularity. Respiratory  Effort:  Normal  Auscultation:  Clear without wheezing or rhonchi Cardiovascular  Auscultation:  Regular rate, without rubs, murmurs or gallops  Edema/varicosities:  Not grossly evident Abdominal  Soft,nontender, without masses, guarding or rebound.  Liver/spleen:  No organomegaly noted  Hernia:  None appreciated  Skin  Inspection:  Grossly normal  Palpation:  Grossly normal Neurologic/psychiatric  Orientation:  Normal with appropriate  conversation.  Mood/affect:  Normal  Genitourinary    Breasts: Examined lying and sitting.     Right: Reduction/lift, no palpable nodules, no nipple discharge or axillary adenopathy.     Left: Mastectomy/implant    Inguinal/mons:  Normal without inguinal adenopathy  External genitalia:  Normal  BUS/Urethra/Skene's glands:  Normal  Bladder:  Normal  Vagina:  Normal  Cervix:  Absent  Uterus:  Absent  Adnexa/parametria:     Rt: Without masses or tenderness.   Lt: Without masses or tenderness.  Anus and perineum: Normal  Digital rectal exam: Normal sphincter tone without palpated masses or tenderness  Assessment/Plan:  66 y.o. M. WF G2 P2  for annual exam.     Postmenopausal TVH Vagifem twice weekly for dryness and itching with good relief Left breast cancer history/mastectomy/implant Hypertension, hypercholesterolemia, IBS-primary care labs and meds  Plan: Last DEXA 2008. instructed to schedule. Reviewed importance of exercise, home safety and fall prevention discussed. SBE's, continue annual mammogram, calcium rich diet, vitamin D 2000 daily encouraged. UA and home Hemoccult card given. No Pap history of normal Paps new screening guidelines reviewed. BRCA testing reviewed and declines states is not sure she wants. Continue care with primary care for medical problems. Vagifem twice weekly prescription, samples, proper use given and reviewed. Reviewed minimal absorption, slight risk for blood clots and strokes reviewed. Has had less problems with recurrent yeast when on. Encouraged lubricants for intercourse.    Harrington Challenger [facility], 1:43 PM 09/26/2011

## 2011-09-27 LAB — URINALYSIS W MICROSCOPIC + REFLEX CULTURE
Bacteria, UA: NONE SEEN
Hgb urine dipstick: NEGATIVE
Protein, ur: NEGATIVE mg/dL
pH: 6.5 (ref 5.0–8.0)

## 2012-04-30 ENCOUNTER — Telehealth: Payer: Self-pay | Admitting: *Deleted

## 2012-04-30 ENCOUNTER — Encounter: Payer: Self-pay | Admitting: *Deleted

## 2012-04-30 MED ORDER — ESTRADIOL 10 MCG VA TABS: 10.0000 ug | ORAL_TABLET | VAGINAL | Status: DC

## 2012-04-30 NOTE — Telephone Encounter (Signed)
Pt called requesting refill on vagifem 10 mcg per nancy note on 09/25/12. Pt takes medication, rx will be sent, annual due in September.

## 2012-07-03 ENCOUNTER — Other Ambulatory Visit: Payer: Self-pay | Admitting: *Deleted

## 2012-07-03 DIAGNOSIS — Z853 Personal history of malignant neoplasm of breast: Secondary | ICD-10-CM

## 2012-07-05 ENCOUNTER — Encounter: Payer: Self-pay | Admitting: Women's Health

## 2012-07-16 ENCOUNTER — Encounter: Payer: Self-pay | Admitting: Women's Health

## 2012-07-16 ENCOUNTER — Ambulatory Visit (INDEPENDENT_AMBULATORY_CARE_PROVIDER_SITE_OTHER): Payer: BC Managed Care – PPO | Admitting: Women's Health

## 2012-07-16 DIAGNOSIS — N898 Other specified noninflammatory disorders of vagina: Secondary | ICD-10-CM

## 2012-07-16 DIAGNOSIS — N899 Noninflammatory disorder of vagina, unspecified: Secondary | ICD-10-CM

## 2012-07-16 DIAGNOSIS — M545 Low back pain: Secondary | ICD-10-CM

## 2012-07-16 LAB — URINALYSIS W MICROSCOPIC + REFLEX CULTURE
Ketones, ur: NEGATIVE mg/dL
Nitrite: NEGATIVE
Protein, ur: NEGATIVE mg/dL
Urobilinogen, UA: 0.2 mg/dL (ref 0.0–1.0)
pH: 6.5 (ref 5.0–8.0)

## 2012-07-16 LAB — WET PREP FOR TRICH, YEAST, CLUE
Clue Cells Wet Prep HPF POC: NONE SEEN
Trich, Wet Prep: NONE SEEN
Yeast Wet Prep HPF POC: NONE SEEN

## 2012-07-16 MED ORDER — FLUCONAZOLE 100 MG PO TABS: ORAL_TABLET | ORAL | Status: DC

## 2012-07-16 NOTE — Progress Notes (Signed)
Patient ID: Tracey Norris, female   DOB: [birth year], 67 y.o.   MRN: [id number] Present with complaint of urinary frequency, vaginal irritation with minimal itching. Has used Diflucan 100x2 doses in the last week. Currently on minocycline for a scalp problem which has helped. Denies fever. History of IBS, some abdominal pain. TVH.  Exam: Appears well, UA: 0 - 2 WBCs,  trace leukocytes,  rare bacteria. External genitalia slightly erythematous at introitus, speculum exam no visible discharge, vaginal walls minimally irritated, wet prep negative. Bimanual nontender.  Normal exam  Plan: Continue Vagifem twice weekly which has helped vaginal itching, reviewed wet prep negative, Diflucan probably rectified yeast. UA culture pending. Continue followup with dermatologist for scalp problem.

## 2012-07-17 LAB — URINE CULTURE: Colony Count: NO GROWTH

## 2012-09-13 DIAGNOSIS — D510 Vitamin B12 deficiency anemia due to intrinsic factor deficiency: Secondary | ICD-10-CM | POA: Diagnosis not present

## 2012-09-18 DIAGNOSIS — L708 Other acne: Secondary | ICD-10-CM | POA: Diagnosis not present

## 2012-09-18 DIAGNOSIS — L981 Factitial dermatitis: Secondary | ICD-10-CM | POA: Diagnosis not present

## 2012-09-18 DIAGNOSIS — L219 Seborrheic dermatitis, unspecified: Secondary | ICD-10-CM | POA: Diagnosis not present

## 2012-09-24 DIAGNOSIS — IMO0002 Reserved for concepts with insufficient information to code with codable children: Secondary | ICD-10-CM | POA: Diagnosis not present

## 2012-09-24 DIAGNOSIS — J019 Acute sinusitis, unspecified: Secondary | ICD-10-CM | POA: Diagnosis not present

## 2012-10-04 DIAGNOSIS — I10 Essential (primary) hypertension: Secondary | ICD-10-CM | POA: Diagnosis not present

## 2012-10-04 DIAGNOSIS — L738 Other specified follicular disorders: Secondary | ICD-10-CM | POA: Diagnosis not present

## 2012-10-04 DIAGNOSIS — E785 Hyperlipidemia, unspecified: Secondary | ICD-10-CM | POA: Diagnosis not present

## 2012-10-04 DIAGNOSIS — E559 Vitamin D deficiency, unspecified: Secondary | ICD-10-CM | POA: Diagnosis not present

## 2012-10-04 DIAGNOSIS — IMO0002 Reserved for concepts with insufficient information to code with codable children: Secondary | ICD-10-CM | POA: Diagnosis not present

## 2012-10-04 DIAGNOSIS — D510 Vitamin B12 deficiency anemia due to intrinsic factor deficiency: Secondary | ICD-10-CM | POA: Diagnosis not present

## 2012-10-04 DIAGNOSIS — Z23 Encounter for immunization: Secondary | ICD-10-CM | POA: Diagnosis not present

## 2012-10-04 DIAGNOSIS — L678 Other hair color and hair shaft abnormalities: Secondary | ICD-10-CM | POA: Diagnosis not present

## 2012-10-22 DIAGNOSIS — E785 Hyperlipidemia, unspecified: Secondary | ICD-10-CM | POA: Diagnosis not present

## 2012-10-22 DIAGNOSIS — E559 Vitamin D deficiency, unspecified: Secondary | ICD-10-CM | POA: Diagnosis not present

## 2012-10-22 DIAGNOSIS — J329 Chronic sinusitis, unspecified: Secondary | ICD-10-CM | POA: Diagnosis not present

## 2012-10-22 DIAGNOSIS — IMO0002 Reserved for concepts with insufficient information to code with codable children: Secondary | ICD-10-CM | POA: Diagnosis not present

## 2012-10-22 DIAGNOSIS — D510 Vitamin B12 deficiency anemia due to intrinsic factor deficiency: Secondary | ICD-10-CM | POA: Diagnosis not present

## 2012-10-31 DIAGNOSIS — R252 Cramp and spasm: Secondary | ICD-10-CM | POA: Diagnosis not present

## 2012-10-31 DIAGNOSIS — M21969 Unspecified acquired deformity of unspecified lower leg: Secondary | ICD-10-CM | POA: Diagnosis not present

## 2012-10-31 DIAGNOSIS — R52 Pain, unspecified: Secondary | ICD-10-CM | POA: Diagnosis not present

## 2012-11-01 ENCOUNTER — Ambulatory Visit (INDEPENDENT_AMBULATORY_CARE_PROVIDER_SITE_OTHER): Payer: Medicare Other | Admitting: Women's Health

## 2012-11-01 ENCOUNTER — Encounter: Payer: Self-pay | Admitting: Women's Health

## 2012-11-01 VITALS — BP 128/76 | Ht 61.25 in | Wt 132.0 lb

## 2012-11-01 DIAGNOSIS — N899 Noninflammatory disorder of vagina, unspecified: Secondary | ICD-10-CM | POA: Diagnosis not present

## 2012-11-01 DIAGNOSIS — N898 Other specified noninflammatory disorders of vagina: Secondary | ICD-10-CM

## 2012-11-01 MED ORDER — FLUCONAZOLE 100 MG PO TABS: ORAL_TABLET | ORAL | Status: DC

## 2012-11-01 NOTE — Patient Instructions (Signed)
pnuemonia vaccine Health Recommendations for Postmenopausal Women Respected and ongoing research has looked at the most common causes of death, disability, and poor quality of life in postmenopausal women. The causes include heart disease, diseases of blood vessels, diabetes, depression, cancer, and bone loss (osteoporosis). Many things can be done to help lower the chances of developing these and other common problems: CARDIOVASCULAR DISEASE Heart Disease: A heart attack is a medical emergency. Know the signs and symptoms of a heart attack. Below are things women can do to reduce their risk for heart disease.   Do not smoke. If you smoke, quit.  Aim for a healthy weight. Being overweight causes many preventable deaths. Eat a healthy and balanced diet and drink an adequate amount of liquids.  Get moving. Make a commitment to be more physically active. Aim for 30 minutes of activity on most, if not all days of the week.  Eat for heart health. Choose a diet that is low in saturated fat and cholesterol and eliminate trans fat. Include whole grains, vegetables, and fruits. Read and understand the labels on food containers before buying.  Know your numbers. Ask your caregiver to check your blood pressure, cholesterol (total, HDL, LDL, triglycerides) and blood glucose. Work with your caregiver on improving your entire clinical picture.  High blood pressure. Limit or stop your table salt intake (try salt substitute and food seasonings). Avoid salty foods and drinks. Read labels on food containers before buying. Eating well and exercising can help control high blood pressure. STROKE  Stroke is a medical emergency. Stroke may be the result of a blood clot in a blood vessel in the brain or by a brain hemorrhage (bleeding). Know the signs and symptoms of a stroke. To lower the risk of developing a stroke:  Avoid fatty foods.  Quit smoking.  Control your diabetes, blood pressure, and irregular heart  rate. THROMBOPHLEBITIS (BLOOD CLOT) OF THE LEG  Becoming overweight and leading a stationary lifestyle may also contribute to developing blood clots. Controlling your diet and exercising will help lower the risk of developing blood clots. CANCER SCREENING  Breast Cancer: Take steps to reduce your risk of breast cancer.  You should practice "breast self-awareness." This means understanding the normal appearance and feel of your breasts and should include breast self-examination. Any changes detected, no matter how small, should be reported to your caregiver.  After age 27, you should have a clinical breast exam (CBE) every year.  Starting at age 48, you should consider having a mammogram (breast X-ray) every year.  If you have a family history of breast cancer, talk to your caregiver about genetic screening.  If you are at high risk for breast cancer, talk to your caregiver about having an MRI and a mammogram every year.  Intestinal or Stomach Cancer: Tests to consider are a rectal exam, fecal occult blood, sigmoidoscopy, and colonoscopy. Women who are high risk may need to be screened at an earlier age and more often.  Cervical Cancer:  Beginning at age 87, you should have a Pap test every 3 years as long as the past 3 Pap tests have been normal.  If you have had past treatment for cervical cancer or a condition that could lead to cancer, you need Pap tests and screening for cancer for at least 20 years after your treatment.  If you had a hysterectomy for a problem that was not cancer or a condition that could lead to cancer, then you no longer need Pap  tests.  If you are between ages 47 and 74, and you have had normal Pap tests going back 10 years, you no longer need Pap tests.  If Pap tests have been discontinued, risk factors (such as a new sexual partner) need to be reassessed to determine if screening should be resumed.  Some medical problems can increase the chance of getting  cervical cancer. In these cases, your caregiver may recommend more frequent screening and Pap tests.  Uterine Cancer: If you have vaginal bleeding after reaching menopause, you should notify your caregiver.  Ovarian cancer: Other than yearly pelvic exams, there are no reliable tests available to screen for ovarian cancer at this time except for yearly pelvic exams.  Lung Cancer: Yearly chest X-rays can detect lung cancer and should be done on high risk women, such as cigarette smokers and women with chronic lung disease (emphysema).  Skin Cancer: A complete body skin exam should be done at your yearly examination. Avoid overexposure to the sun and ultraviolet light lamps. Use a strong sun block cream when in the sun. All of these things are important in lowering the risk of skin cancer. MENOPAUSE Menopause Symptoms: Hormone therapy products are effective for treating symptoms associated with menopause:  Moderate to severe hot flashes.  Night sweats.  Mood swings.  Headaches.  Tiredness.  Loss of sex drive.  Insomnia.  Other symptoms. Hormone replacement carries certain risks, especially in older women. Women who use or are thinking about using estrogen or estrogen with progestin treatments should discuss that with their caregiver. Your caregiver will help you understand the benefits and risks. The ideal dose of hormone replacement therapy is not known. The Food and Drug Administration (FDA) has concluded that hormone therapy should be used only at the lowest doses and for the shortest amount of time to reach treatment goals.  OSTEOPOROSIS Protecting Against Bone Loss and Preventing Fracture: If you use hormone therapy for prevention of bone loss (osteoporosis), the risks for bone loss must outweigh the risk of the therapy. Ask your caregiver about other medications known to be safe and effective for preventing bone loss and fractures. To guard against bone loss or fractures, the  following is recommended:  If you are less than age 79, take 1000 mg of calcium and at least 600 mg of Vitamin D per day.  If you are greater than age 40 but less than age 61, take 1200 mg of calcium and at least 600 mg of Vitamin D per day.  If you are greater than age 55, take 1200 mg of calcium and at least 800 mg of Vitamin D per day. Smoking and excessive alcohol intake increases the risk of osteoporosis. Eat foods rich in calcium and vitamin D and do weight bearing exercises several times a week as your caregiver suggests. DIABETES Diabetes Melitus: If you have Type I or Type 2 diabetes, you should keep your blood sugar under control with diet, exercise and recommended medication. Avoid too many sweets, starchy and fatty foods. Being overweight can make control more difficult. COGNITION AND MEMORY Cognition and Memory: Menopausal hormone therapy is not recommended for the prevention of cognitive disorders such as Alzheimer's disease or memory loss.  DEPRESSION  Depression may occur at any age, but is common in elderly women. The reasons may be because of physical, medical, social (loneliness), or financial problems and needs. If you are experiencing depression because of medical problems and control of symptoms, talk to your caregiver about this. Physical  activity and exercise may help with mood and sleep. Community and volunteer involvement may help your sense of value and worth. If you have depression and you feel that the problem is getting worse or becoming severe, talk to your caregiver about treatment options that are best for you. ACCIDENTS  Accidents are common and can be serious in the elderly woman. Prepare your house to prevent accidents. Eliminate throw rugs, place hand bars in the bath, shower and toilet areas. Avoid wearing high heeled shoes or walking on wet, snowy, and icy areas. Limit or stop driving if you have vision or hearing problems, or you feel you are unsteady with you  movements and reflexes. HEPATITIS C Hepatitis C is a type of viral infection affecting the liver. It is spread mainly through contact with blood from an infected person. It can be treated, but if left untreated, it can lead to severe liver damage over years. Many people who are infected do not know that the virus is in their blood. If you are a "baby-boomer", it is recommended that you have one screening test for Hepatitis C. IMMUNIZATIONS  Several immunizations are important to consider having during your senior years, including:   Tetanus, diptheria, and pertussis booster shot.  Influenza every year before the flu season begins.  Pneumonia vaccine.  Shingles vaccine.  Others as indicated based on your specific needs. Talk to your caregiver about these. Document Released: 02/18/2005 Document Revised: 12/14/2011 Document Reviewed: 10/15/2007 [facility] Patient Information 2014 [place], [place].

## 2012-11-01 NOTE — Progress Notes (Signed)
Tracey Norris 20-May-1945 [id number]    History:    The patient presents for breast and pelvic exam. 1985 - Left breast mastectomy -ER, reconstruction 2007. Negative colonoscopy 2012. TVH for DUB using Vagifem twice weekly for vaginal dryness/burning with good relief of symptoms. History of recurrent UTIs and yeast, less frequent this past year. Hypertension/hypercholesterolemia/IBS management by primary care. Problems with a scalp bacterial infection on long-term medication with some relief per Dermatologist. Currently seeing a podiatrist for foot pain and poor circulation. 2008 DEXA T score -1.2 at right femoral neck, hip average -0.3. Normal Pap history.  Past medical history, past surgical history, family history and social history were all reviewed and documented in the EPIC chart. 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 2 children both doing well. Mother breast cancer age 37 died from  MI at 22.  Exam:  Filed Vitals:   11/01/12 1218  BP: 128/76    General appearance:  Normal Head/Neck:  Normal, without cervical or supraclavicular adenopathy. Thyroid:  Symmetrical, normal in size, without palpable masses or nodularity. Respiratory  Effort:  Normal  Auscultation:  Clear without wheezing or rhonchi Cardiovascular  Auscultation:  Regular rate, without rubs, murmurs or gallops  Edema/varicosities:  Not grossly evident Abdominal  Soft,nontender, without masses, guarding or rebound.  Liver/spleen:  No organomegaly noted  Hernia:  None appreciated  Skin  Inspection:  Grossly normal  Palpation:  Grossly normal Neurologic/psychiatric  Orientation:  Normal with appropriate conversation.  Mood/affect:  Normal  Genitourinary    Breasts: Examined lying and sitting.     Right: Without masses, retractions, discharge or axillary adenopathy/reduction.     Left: Mastectomy with implant   Inguinal/mons:  Normal without inguinal adenopathy  External genitalia:   Normal  BUS/Urethra/Skene's glands:  Normal  Bladder:  Normal  Vagina:  Normal  Cervix:  Absent  Uterus:  Absent  Adnexa/parametria:     Rt: Without masses or tenderness.   Lt: Without masses or tenderness.  Anus and perineum: Normal  Digital rectal exam: Normal sphincter tone without palpated masses or tenderness  Assessment/Plan:  67 y.o.  MWF G2P39for breast and pelvic exam.  TVH for DUB Vagifem 1985 Lt breast cancer/ mastectomy/reconstructed Hypertension/hypercholesterolemia/IBS-primary care labs and meds Bacterial infection of scalp-dermatologist Osteopenia  Plan: Instructed to return to primary care for Pneumovax. SBE's, continue annual mammogram, 3-D tomography reviewed and encouraged. Vagifem prescription, one per vagina one to 2 times weekly, proper use, samples given and reviewed slight risk for blood clots, strokes, breast cancer. Repeat DEXA. Instructed to schedule. Home safety, fall prevention and importance of regular exercise reviewed. Vitamin D 2000 daily encouraged.   Harrington Challenger [facility], 12:58 PM 11/01/2012

## 2012-11-05 DIAGNOSIS — R252 Cramp and spasm: Secondary | ICD-10-CM | POA: Diagnosis not present

## 2012-11-05 DIAGNOSIS — G2581 Restless legs syndrome: Secondary | ICD-10-CM | POA: Diagnosis not present

## 2012-11-05 DIAGNOSIS — IMO0001 Reserved for inherently not codable concepts without codable children: Secondary | ICD-10-CM | POA: Diagnosis not present

## 2012-11-09 DIAGNOSIS — L981 Factitial dermatitis: Secondary | ICD-10-CM | POA: Diagnosis not present

## 2012-11-09 DIAGNOSIS — L708 Other acne: Secondary | ICD-10-CM | POA: Diagnosis not present

## 2012-11-12 DIAGNOSIS — J310 Chronic rhinitis: Secondary | ICD-10-CM | POA: Diagnosis not present

## 2012-11-15 DIAGNOSIS — H2510 Age-related nuclear cataract, unspecified eye: Secondary | ICD-10-CM | POA: Diagnosis not present

## 2012-11-15 DIAGNOSIS — H43819 Vitreous degeneration, unspecified eye: Secondary | ICD-10-CM | POA: Diagnosis not present

## 2012-11-22 DIAGNOSIS — L981 Factitial dermatitis: Secondary | ICD-10-CM | POA: Diagnosis not present

## 2012-11-22 DIAGNOSIS — L219 Seborrheic dermatitis, unspecified: Secondary | ICD-10-CM | POA: Diagnosis not present

## 2012-12-04 DIAGNOSIS — R040 Epistaxis: Secondary | ICD-10-CM | POA: Diagnosis not present

## 2012-12-07 DIAGNOSIS — D510 Vitamin B12 deficiency anemia due to intrinsic factor deficiency: Secondary | ICD-10-CM | POA: Diagnosis not present

## 2012-12-10 ENCOUNTER — Ambulatory Visit: Payer: Medicare Other | Admitting: Podiatry

## 2012-12-11 ENCOUNTER — Ambulatory Visit (INDEPENDENT_AMBULATORY_CARE_PROVIDER_SITE_OTHER): Payer: Medicare Other

## 2012-12-11 ENCOUNTER — Encounter: Payer: Self-pay | Admitting: Podiatrist

## 2012-12-11 ENCOUNTER — Ambulatory Visit (INDEPENDENT_AMBULATORY_CARE_PROVIDER_SITE_OTHER): Payer: Medicare Other | Admitting: Podiatrist

## 2012-12-11 VITALS — BP 145/82 | HR 61 | Resp 18

## 2012-12-11 DIAGNOSIS — M216X9 Other acquired deformities of unspecified foot: Secondary | ICD-10-CM | POA: Diagnosis not present

## 2012-12-11 DIAGNOSIS — R252 Cramp and spasm: Secondary | ICD-10-CM

## 2012-12-11 DIAGNOSIS — M79609 Pain in unspecified limb: Secondary | ICD-10-CM | POA: Diagnosis not present

## 2012-12-11 DIAGNOSIS — M722 Plantar fascial fibromatosis: Secondary | ICD-10-CM

## 2012-12-11 NOTE — Progress Notes (Signed)
   Subjective:    Patient ID: Tracey Norris, female    DOB: Jul 06, 1945, 67 y.o.   MRN: [id number]  HPI patient presents today for generalized foot pain bilateral. She states that the left foot is larger than the right, she has high arches, she has pain on the tops of bilateral feet, and her feet tingle and cramp and she can only wear certain shoes. She also states she was on antibiotics for over 30 days for some scalp issues and she's concerned that the antibiotics may have caused her feet to hurt and cramp. She has tried shoes from the shoe market, big deal shoes, and easy Spirits.  Overall she has difficulty getting comfortable in shoes   Review of Systems  Constitutional: Negative.   HENT: Positive for sinus pressure.   Eyes: Positive for itching.  Respiratory: Negative.   Cardiovascular:       Leg pain when walking  Gastrointestinal: Negative.   Endocrine: Negative.   Genitourinary: Negative.   Musculoskeletal:       Muscle pain  Skin: Negative.   Allergic/Immunologic: Negative.   Neurological: Negative.   Hematological: Negative.   Psychiatric/Behavioral: Negative.        Objective:   Physical Exam GENERAL APPEARANCE: Alert, conversant. Appropriately groomed. No acute distress.  VASCULAR: Pedal pulses palpable and strong bilateral.  Capillary refill time is immediate to all digits,  Proximal to distal cooling it warm to warm.  Digital hair growth is present bilateral  NEUROLOGIC: sensation is intact epicritically and protectively to 5.07 monofilament at 5/5 sites bilateral.  Light touch is intact bilateral, vibratory sensation intact bilateral, achilles tendon reflex is intact bilateral.  MUSCULOSKELETAL: acceptable muscle strength, tone and stability bilateral.  Intrinsic muscluature intact bilateral.  High arched foot type is noted bilateral. Prominent navicular and cuneiforms on the medial aspect of the dorsal foot also noted bilateral. No bony exostosis  present.  DERMATOLOGIC: Fat pad atrophy is noted on the plantar aspect of bilateral feet. Slight decrease in skin color, texture, and turgor is noted bilateral. Discomfort on the dorsal aspect of bilateral feet at the naviculocuneiform region is also present.    Assessment & Plan:  Cavus foot type bilateral, fat pad atrophy bilateral  Plan: Discussed shoes, inserts, and padding. Her main issue is trying to find a shoe that is comfortable and I recommended inserts. She tried on some power step inserts in the office and found they were uncomfortable in the arch on the left foot. I then recommended custom inserts however I did caution her that these will push up on her arch and may cause discomfort on the top of her foot in certain shoes. Also recommended she try on Dansko shoes as these already have a high arch built into them and she might findings comfortable. She will consider the custom orthotic devices and will contact us if she wishes to be molded.  Marlowe Aschoff DPM

## 2012-12-11 NOTE — Patient Instructions (Signed)
You have a very high arched foot which then places pressure on the tops of your feet in certain shoes.  Be careful about the placement of straps on the tops of shoes so they don't irritate your foot.  Try the felt on the straps to see if this will help.   Dansko Shoes have the highest arches in shoes that I know of... Try several pairs of the same size shoe to try and find the sizes that fit you the best.    The only alternative I could provide would be custom orthotic inserts.  They will support the arch but in doing so will probably still cause a little pressure on the top of your foot in certain shoes.

## 2012-12-18 DIAGNOSIS — L219 Seborrheic dermatitis, unspecified: Secondary | ICD-10-CM | POA: Diagnosis not present

## 2012-12-18 DIAGNOSIS — L981 Factitial dermatitis: Secondary | ICD-10-CM | POA: Diagnosis not present

## 2013-01-04 DIAGNOSIS — IMO0002 Reserved for concepts with insufficient information to code with codable children: Secondary | ICD-10-CM | POA: Diagnosis not present

## 2013-01-04 DIAGNOSIS — J3489 Other specified disorders of nose and nasal sinuses: Secondary | ICD-10-CM | POA: Diagnosis not present

## 2013-01-04 DIAGNOSIS — H8190 Unspecified disorder of vestibular function, unspecified ear: Secondary | ICD-10-CM | POA: Diagnosis not present

## 2013-01-07 DIAGNOSIS — D510 Vitamin B12 deficiency anemia due to intrinsic factor deficiency: Secondary | ICD-10-CM | POA: Diagnosis not present

## 2013-01-15 DIAGNOSIS — N651 Disproportion of reconstructed breast: Secondary | ICD-10-CM | POA: Diagnosis not present

## 2013-01-15 DIAGNOSIS — Z978 Presence of other specified devices: Secondary | ICD-10-CM | POA: Diagnosis not present

## 2013-01-18 DIAGNOSIS — F411 Generalized anxiety disorder: Secondary | ICD-10-CM | POA: Diagnosis not present

## 2013-01-18 DIAGNOSIS — I10 Essential (primary) hypertension: Secondary | ICD-10-CM | POA: Diagnosis not present

## 2013-01-18 DIAGNOSIS — J3489 Other specified disorders of nose and nasal sinuses: Secondary | ICD-10-CM | POA: Diagnosis not present

## 2013-01-18 DIAGNOSIS — IMO0002 Reserved for concepts with insufficient information to code with codable children: Secondary | ICD-10-CM | POA: Diagnosis not present

## 2013-01-24 DIAGNOSIS — H43819 Vitreous degeneration, unspecified eye: Secondary | ICD-10-CM | POA: Diagnosis not present

## 2013-01-24 DIAGNOSIS — H2510 Age-related nuclear cataract, unspecified eye: Secondary | ICD-10-CM | POA: Diagnosis not present

## 2013-02-08 DIAGNOSIS — D510 Vitamin B12 deficiency anemia due to intrinsic factor deficiency: Secondary | ICD-10-CM | POA: Diagnosis not present

## 2013-02-11 DIAGNOSIS — R0789 Other chest pain: Secondary | ICD-10-CM | POA: Diagnosis not present

## 2013-02-11 DIAGNOSIS — K219 Gastro-esophageal reflux disease without esophagitis: Secondary | ICD-10-CM | POA: Diagnosis not present

## 2013-02-11 DIAGNOSIS — G478 Other sleep disorders: Secondary | ICD-10-CM | POA: Diagnosis not present

## 2013-02-11 DIAGNOSIS — J329 Chronic sinusitis, unspecified: Secondary | ICD-10-CM | POA: Diagnosis not present

## 2013-02-11 DIAGNOSIS — IMO0002 Reserved for concepts with insufficient information to code with codable children: Secondary | ICD-10-CM | POA: Diagnosis not present

## 2013-03-12 DIAGNOSIS — D510 Vitamin B12 deficiency anemia due to intrinsic factor deficiency: Secondary | ICD-10-CM | POA: Diagnosis not present

## 2013-03-18 DIAGNOSIS — K589 Irritable bowel syndrome without diarrhea: Secondary | ICD-10-CM | POA: Diagnosis not present

## 2013-03-18 DIAGNOSIS — K5900 Constipation, unspecified: Secondary | ICD-10-CM | POA: Diagnosis not present

## 2013-03-20 ENCOUNTER — Telehealth: Payer: Self-pay | Admitting: *Deleted

## 2013-03-20 NOTE — Telephone Encounter (Signed)
Pt called requesting a name of Foot MD gave patient name and # to [facility].

## 2013-03-26 DIAGNOSIS — L219 Seborrheic dermatitis, unspecified: Secondary | ICD-10-CM | POA: Diagnosis not present

## 2013-03-26 DIAGNOSIS — R5383 Other fatigue: Secondary | ICD-10-CM | POA: Diagnosis not present

## 2013-03-26 DIAGNOSIS — R5381 Other malaise: Secondary | ICD-10-CM | POA: Diagnosis not present

## 2013-03-26 DIAGNOSIS — M1990 Unspecified osteoarthritis, unspecified site: Secondary | ICD-10-CM | POA: Diagnosis not present

## 2013-03-26 DIAGNOSIS — M21969 Unspecified acquired deformity of unspecified lower leg: Secondary | ICD-10-CM | POA: Diagnosis not present

## 2013-03-26 DIAGNOSIS — L981 Factitial dermatitis: Secondary | ICD-10-CM | POA: Diagnosis not present

## 2013-03-29 ENCOUNTER — Ambulatory Visit: Payer: Medicare Other | Admitting: Podiatrist

## 2013-04-16 DIAGNOSIS — D510 Vitamin B12 deficiency anemia due to intrinsic factor deficiency: Secondary | ICD-10-CM | POA: Diagnosis not present

## 2013-04-25 DIAGNOSIS — M722 Plantar fascial fibromatosis: Secondary | ICD-10-CM | POA: Diagnosis not present

## 2013-04-26 DIAGNOSIS — R110 Nausea: Secondary | ICD-10-CM | POA: Diagnosis not present

## 2013-04-26 DIAGNOSIS — IMO0002 Reserved for concepts with insufficient information to code with codable children: Secondary | ICD-10-CM | POA: Diagnosis not present

## 2013-04-26 DIAGNOSIS — R109 Unspecified abdominal pain: Secondary | ICD-10-CM | POA: Diagnosis not present

## 2013-04-30 ENCOUNTER — Encounter: Payer: Self-pay | Admitting: Women's Health

## 2013-04-30 ENCOUNTER — Ambulatory Visit (INDEPENDENT_AMBULATORY_CARE_PROVIDER_SITE_OTHER): Payer: Medicare Other | Admitting: Women's Health

## 2013-04-30 DIAGNOSIS — R82998 Other abnormal findings in urine: Secondary | ICD-10-CM | POA: Diagnosis not present

## 2013-04-30 DIAGNOSIS — N899 Noninflammatory disorder of vagina, unspecified: Secondary | ICD-10-CM

## 2013-04-30 DIAGNOSIS — R350 Frequency of micturition: Secondary | ICD-10-CM | POA: Diagnosis not present

## 2013-04-30 DIAGNOSIS — N898 Other specified noninflammatory disorders of vagina: Secondary | ICD-10-CM

## 2013-04-30 LAB — URINALYSIS W MICROSCOPIC + REFLEX CULTURE
Bilirubin Urine: NEGATIVE
CASTS: NONE SEEN
CRYSTALS: NONE SEEN
GLUCOSE, UA: NEGATIVE mg/dL
Hgb urine dipstick: NEGATIVE
KETONES UR: NEGATIVE mg/dL
NITRITE: NEGATIVE
PH: 6.5 (ref 5.0–8.0)
Protein, ur: NEGATIVE mg/dL
RBC / HPF: NONE SEEN RBC/hpf (ref <3)
SPECIFIC GRAVITY, URINE: 1.01 (ref 1.005–1.030)
Urobilinogen, UA: 0.2 mg/dL (ref 0.0–1.0)

## 2013-04-30 LAB — WET PREP FOR TRICH, YEAST, CLUE
Clue Cells Wet Prep HPF POC: NONE SEEN
Trich, Wet Prep: NONE SEEN
Yeast Wet Prep HPF POC: NONE SEEN

## 2013-04-30 NOTE — Progress Notes (Signed)
Patient ID: Roderick Sweezy, female   DOB: May 30, 1945, 68 y.o.   MRN: [id number] Presents with vaginal irritation and abdominal bloating for 1 week. Denies urinary symptoms, back pain, fever. Uses Vagifem for vaginal irritation but has had to decrease because of cost. History of breast cancer. Primary care and gastroenterologist working with medications for reflux and IBS. History of recurrent yeast.  Exam: Appears well, external genitalia erythematous. Wet prep few WBC and bacteria. UA: few bacteria and 7-10 WBC  POSSIBLE UTI  Plan: History of yeast after antibiotic use so will wait on culture results for treatment. UTI prevention discussed. Discussed using vaginal lubricants and if able Vagifem regularly to prevent irritation.

## 2013-05-02 ENCOUNTER — Telehealth: Payer: Self-pay | Admitting: *Deleted

## 2013-05-02 LAB — URINE CULTURE
Colony Count: NO GROWTH
Organism ID, Bacteria: NO GROWTH

## 2013-05-02 MED ORDER — ESTRADIOL 10 MCG VA TABS: 1.0000 | ORAL_TABLET | VAGINAL | Status: DC

## 2013-05-02 NOTE — Telephone Encounter (Signed)
Pt called requesting 90 day supply for vagifem 10 mcg, annual due in Oct. 2014 Rx sent

## 2013-05-08 DIAGNOSIS — F429 Obsessive-compulsive disorder, unspecified: Secondary | ICD-10-CM | POA: Diagnosis not present

## 2013-05-08 DIAGNOSIS — I10 Essential (primary) hypertension: Secondary | ICD-10-CM | POA: Diagnosis not present

## 2013-05-08 DIAGNOSIS — E559 Vitamin D deficiency, unspecified: Secondary | ICD-10-CM | POA: Diagnosis not present

## 2013-05-08 DIAGNOSIS — M531 Cervicobrachial syndrome: Secondary | ICD-10-CM | POA: Diagnosis not present

## 2013-05-08 DIAGNOSIS — D510 Vitamin B12 deficiency anemia due to intrinsic factor deficiency: Secondary | ICD-10-CM | POA: Diagnosis not present

## 2013-05-20 DIAGNOSIS — D510 Vitamin B12 deficiency anemia due to intrinsic factor deficiency: Secondary | ICD-10-CM | POA: Diagnosis not present

## 2013-05-23 DIAGNOSIS — L981 Factitial dermatitis: Secondary | ICD-10-CM | POA: Diagnosis not present

## 2013-05-23 DIAGNOSIS — L219 Seborrheic dermatitis, unspecified: Secondary | ICD-10-CM | POA: Diagnosis not present

## 2013-06-21 DIAGNOSIS — D510 Vitamin B12 deficiency anemia due to intrinsic factor deficiency: Secondary | ICD-10-CM | POA: Diagnosis not present

## 2013-07-08 DIAGNOSIS — E876 Hypokalemia: Secondary | ICD-10-CM | POA: Diagnosis not present

## 2013-07-19 DIAGNOSIS — E876 Hypokalemia: Secondary | ICD-10-CM | POA: Diagnosis not present

## 2013-07-22 DIAGNOSIS — E876 Hypokalemia: Secondary | ICD-10-CM | POA: Diagnosis not present

## 2013-07-22 DIAGNOSIS — I10 Essential (primary) hypertension: Secondary | ICD-10-CM | POA: Diagnosis not present

## 2013-07-22 DIAGNOSIS — D510 Vitamin B12 deficiency anemia due to intrinsic factor deficiency: Secondary | ICD-10-CM | POA: Diagnosis not present

## 2013-07-22 DIAGNOSIS — IMO0002 Reserved for concepts with insufficient information to code with codable children: Secondary | ICD-10-CM | POA: Diagnosis not present

## 2013-07-28 DIAGNOSIS — R5381 Other malaise: Secondary | ICD-10-CM | POA: Diagnosis not present

## 2013-07-28 DIAGNOSIS — R5383 Other fatigue: Secondary | ICD-10-CM | POA: Diagnosis not present

## 2013-08-08 DIAGNOSIS — E785 Hyperlipidemia, unspecified: Secondary | ICD-10-CM | POA: Diagnosis not present

## 2013-08-08 DIAGNOSIS — R7301 Impaired fasting glucose: Secondary | ICD-10-CM | POA: Diagnosis not present

## 2013-08-08 DIAGNOSIS — E876 Hypokalemia: Secondary | ICD-10-CM | POA: Diagnosis not present

## 2013-08-09 DIAGNOSIS — Z0000 Encounter for general adult medical examination without abnormal findings: Secondary | ICD-10-CM | POA: Diagnosis not present

## 2013-08-15 ENCOUNTER — Ambulatory Visit (INDEPENDENT_AMBULATORY_CARE_PROVIDER_SITE_OTHER): Payer: Medicare Other | Admitting: Women's Health

## 2013-08-15 ENCOUNTER — Encounter: Payer: Self-pay | Admitting: Women's Health

## 2013-08-15 DIAGNOSIS — N899 Noninflammatory disorder of vagina, unspecified: Secondary | ICD-10-CM

## 2013-08-15 DIAGNOSIS — R339 Retention of urine, unspecified: Secondary | ICD-10-CM

## 2013-08-15 DIAGNOSIS — N898 Other specified noninflammatory disorders of vagina: Secondary | ICD-10-CM

## 2013-08-15 LAB — URINALYSIS W MICROSCOPIC + REFLEX CULTURE
Bilirubin Urine: NEGATIVE
Casts: NONE SEEN
Crystals: NONE SEEN
GLUCOSE, UA: NEGATIVE mg/dL
Hgb urine dipstick: NEGATIVE
Ketones, ur: NEGATIVE mg/dL
Nitrite: NEGATIVE
Protein, ur: NEGATIVE mg/dL
RBC / HPF: NONE SEEN RBC/hpf (ref <3)
Specific Gravity, Urine: 1.01 (ref 1.005–1.030)
UROBILINOGEN UA: 0.2 mg/dL (ref 0.0–1.0)
pH: 6.5 (ref 5.0–8.0)

## 2013-08-15 LAB — WET PREP FOR TRICH, YEAST, CLUE
Clue Cells Wet Prep HPF POC: NONE SEEN
Trich, Wet Prep: NONE SEEN

## 2013-08-15 NOTE — Progress Notes (Signed)
Patient ID: Tracey Norris, female   DOB: [birth year], 68 y.o.   MRN: [id number] Presents with complaint of vaginal irritation and urinary frequency. History of recurrent yeast Denies pain or burning with urination. Postmenopausal on no HRT. Struggling with IBS does have followup scheduled. Denies abdominal pain, fever.  Exam: UA: Small leukocytes, 3-6 WBCs, few bacteria. Appears well. External genitalia minimal erythema at introitus, wet prep with Q-tip positive for a few yeast.   Yeast vaginitis IBS  Plan: Urine culture pending, Diflucan 100 by mouth today repeat in 3 days if needed. Aware of yeast prevention.

## 2013-08-16 LAB — URINE CULTURE
Colony Count: NO GROWTH
ORGANISM ID, BACTERIA: NO GROWTH

## 2013-08-22 DIAGNOSIS — D510 Vitamin B12 deficiency anemia due to intrinsic factor deficiency: Secondary | ICD-10-CM | POA: Diagnosis not present

## 2013-08-28 ENCOUNTER — Ambulatory Visit (INDEPENDENT_AMBULATORY_CARE_PROVIDER_SITE_OTHER): Payer: Medicare Other | Admitting: Women's Health

## 2013-08-28 DIAGNOSIS — A499 Bacterial infection, unspecified: Secondary | ICD-10-CM | POA: Diagnosis not present

## 2013-08-28 DIAGNOSIS — N760 Acute vaginitis: Secondary | ICD-10-CM | POA: Diagnosis not present

## 2013-08-28 DIAGNOSIS — R350 Frequency of micturition: Secondary | ICD-10-CM | POA: Diagnosis not present

## 2013-08-28 DIAGNOSIS — B9689 Other specified bacterial agents as the cause of diseases classified elsewhere: Secondary | ICD-10-CM

## 2013-08-28 LAB — WET PREP FOR TRICH, YEAST, CLUE
Trich, Wet Prep: NONE SEEN
YEAST WET PREP: NONE SEEN

## 2013-08-28 LAB — URINALYSIS
Bilirubin Urine: NEGATIVE
GLUCOSE, UA: NEGATIVE mg/dL
Ketones, ur: NEGATIVE mg/dL
Nitrite: NEGATIVE
PH: 5.5 (ref 5.0–8.0)
Protein, ur: NEGATIVE mg/dL
Specific Gravity, Urine: 1.015 (ref 1.005–1.030)
Urobilinogen, UA: 0.2 mg/dL (ref 0.0–1.0)

## 2013-08-28 MED ORDER — ESTRADIOL 10 MCG VA TABS: ORAL_TABLET | VAGINAL | Status: DC

## 2013-08-28 MED ORDER — METRONIDAZOLE 0.75 % VA GEL: VAGINAL | Status: DC

## 2013-08-28 NOTE — Patient Instructions (Signed)
Bacterial Vaginosis Bacterial vaginosis is an infection of the vagina. It happens when too many of certain germs (bacteria) grow in the vagina. HOME CARE  Take your medicine as told by your doctor.  Finish your medicine even if you start to feel better.  Do not have sex until you finish your medicine and are better.  Tell your sex partner that you have an infection. They should see their doctor for treatment.  Practice safe sex. Use condoms. Have only one sex partner. GET HELP IF:  You are not getting better after 3 days of treatment.  You have more grey fluid (discharge) coming from your vagina than before.  You have more pain than before.  You have a fever. MAKE SURE YOU:   Understand these instructions.  Will watch your condition.  Will get help right away if you are not doing well or get worse. Document Released: 10/06/2007 Document Revised: 10/17/2012 Document Reviewed: 08/08/2012 [employer] Patient Information 2015 [employer]. This information is not intended to replace advice given to you by your health care provider. Make sure you discuss any questions you have with your health care provider.  

## 2013-08-28 NOTE — Progress Notes (Signed)
Patient ID: Tracey Norris, female   DOB: [birth year], 68 y.o.   MRN: [id number] Presents with complaint of vaginal burning and dryness. Questionable yeast infection, has used Diflucan 100 mg twice last week. Symptoms are better but not resolved. Has had problems with recurrent yeast. IBS on probiotic. Vagifem twice weekly but had not been using. Urinary frequency without pain . Continues to have problems with reflux. Has followup scheduled with GI  Exam: Appears well. External genitalia slight erythema, speculum exam scant discharge wet prep positive for clues, and TNTC bacteria. UA: Trace leukocytes, 0-2 WBCs, rare bacteria  Bacteria vaginosis  Plan: MetroGel vaginal cream 1 applicator at bedtime x5, prescription, proper use, alcohol precautions reviewed. Instructed to call if no relief of symptoms. Urine culture pending.

## 2013-09-02 DIAGNOSIS — N6009 Solitary cyst of unspecified breast: Secondary | ICD-10-CM | POA: Diagnosis not present

## 2013-09-02 DIAGNOSIS — R928 Other abnormal and inconclusive findings on diagnostic imaging of breast: Secondary | ICD-10-CM | POA: Diagnosis not present

## 2013-09-02 DIAGNOSIS — Z853 Personal history of malignant neoplasm of breast: Secondary | ICD-10-CM | POA: Diagnosis not present

## 2013-09-03 ENCOUNTER — Encounter: Payer: Self-pay | Admitting: Women's Health

## 2013-09-06 ENCOUNTER — Ambulatory Visit (INDEPENDENT_AMBULATORY_CARE_PROVIDER_SITE_OTHER): Payer: Medicare Other | Admitting: Women's Health

## 2013-09-06 ENCOUNTER — Encounter: Payer: Self-pay | Admitting: Women's Health

## 2013-09-06 DIAGNOSIS — N9489 Other specified conditions associated with female genital organs and menstrual cycle: Secondary | ICD-10-CM

## 2013-09-06 DIAGNOSIS — N949 Unspecified condition associated with female genital organs and menstrual cycle: Secondary | ICD-10-CM

## 2013-09-06 LAB — WET PREP FOR TRICH, YEAST, CLUE
CLUE CELLS WET PREP: NONE SEEN
Trich, Wet Prep: NONE SEEN

## 2013-09-06 NOTE — Progress Notes (Signed)
Patient ID: Tracey Norris, female   DOB: [birth year], 68 y.o.   MRN: [id number] Presents with complaint of vaginal irritation, dry burning sensation with mild itching. Using Vagifem twice weekly. Had  bacterial vaginosis several weeks ago those symptoms have resolved. Denies any urinary symptoms, abdominal pain or fever. Continues to have acid reflux problems and does have followup scheduled. Postmenopausal with no bleeding.  Exam: Appears well. External genitalia slight erythema at introitus, speculum exam scant amount of a curdy white discharge noted, wet prep positive for yeast.  Yeast vaginitis  Plan: Diflucan 100 mg today and repeat in 2 days. Instructed to call if no relief of symptoms. Aware of  Yeast prevention.

## 2013-09-06 NOTE — Patient Instructions (Signed)

## 2013-09-10 ENCOUNTER — Telehealth: Payer: Self-pay | Admitting: *Deleted

## 2013-09-10 NOTE — Telephone Encounter (Signed)
Pt calling to follow up from [place] 09/06/13 took diflucan last Friday and last Sunday still feeling raw and irritation. Still taking vagifem as directed, pt told to call and follow up.

## 2013-09-10 NOTE — Telephone Encounter (Signed)
Telephone call, states feeling somewhat better will try 1 more Diflucan, continue yeast prevention and will call if continued problems.

## 2013-09-12 ENCOUNTER — Other Ambulatory Visit: Payer: Self-pay | Admitting: Women's Health

## 2013-09-12 ENCOUNTER — Encounter: Payer: Self-pay | Admitting: Women's Health

## 2013-09-12 ENCOUNTER — Telehealth: Payer: Self-pay | Admitting: *Deleted

## 2013-09-12 ENCOUNTER — Ambulatory Visit (INDEPENDENT_AMBULATORY_CARE_PROVIDER_SITE_OTHER): Payer: Medicare Other | Admitting: Women's Health

## 2013-09-12 DIAGNOSIS — N899 Noninflammatory disorder of vagina, unspecified: Secondary | ICD-10-CM

## 2013-09-12 DIAGNOSIS — R1032 Left lower quadrant pain: Secondary | ICD-10-CM | POA: Diagnosis not present

## 2013-09-12 DIAGNOSIS — R1031 Right lower quadrant pain: Secondary | ICD-10-CM

## 2013-09-12 DIAGNOSIS — N898 Other specified noninflammatory disorders of vagina: Secondary | ICD-10-CM

## 2013-09-12 LAB — URINALYSIS W MICROSCOPIC + REFLEX CULTURE
BILIRUBIN URINE: NEGATIVE
GLUCOSE, UA: NEGATIVE mg/dL
Hgb urine dipstick: NEGATIVE
KETONES UR: NEGATIVE mg/dL
Leukocytes, UA: NEGATIVE
Nitrite: NEGATIVE
PH: 5.5 (ref 5.0–8.0)
Protein, ur: NEGATIVE mg/dL
Urobilinogen, UA: 0.2 mg/dL (ref 0.0–1.0)

## 2013-09-12 LAB — WET PREP FOR TRICH, YEAST, CLUE
Clue Cells Wet Prep HPF POC: NONE SEEN
Trich, Wet Prep: NONE SEEN

## 2013-09-12 MED ORDER — TERCONAZOLE 80 MG VA SUPP: 80.0000 mg | Freq: Every day | VAGINAL | Status: DC

## 2013-09-12 NOTE — Progress Notes (Signed)
Patient ID: Tracey Norris, female   DOB: 06-05-1945, 68 y.o.   MRN: [id number] Presents with continued low pelvic discomfort, vaginal irritation with mild itching, burning and dryness. Was treated for yeast with Diflucan last week, has had some relief but not resolved. Vagifem twice weekly. Denies pain or burning with urination but has had problems with recurrent yeast and UTIs in the past. IBS, acid reflux and GERD. 1985 left breast cancer. TVH.  Exam: Appears well. UA: Negative. Abdomen soft no rebound or radiation of pain, external genitalia minimal erythema, wet prep positive for few yeast.  Persistent yeast  Plan: Diflucan 100mg  2 tablets today, if symptoms do not resolve  Diflucan 100mg  in 2 days. Aware of yeast prevention. Instructed to call if no relief.

## 2013-09-12 NOTE — Telephone Encounter (Signed)
Telephone call, states would like to try the Terazol suppositories, Rx call and will call if no relief.

## 2013-09-12 NOTE — Telephone Encounter (Signed)
Pt had OV today and said she would like to have the other rx you spoke with her about a different medications. Please advise

## 2013-09-18 ENCOUNTER — Encounter: Payer: Self-pay | Admitting: Women's Health

## 2013-09-18 ENCOUNTER — Ambulatory Visit (INDEPENDENT_AMBULATORY_CARE_PROVIDER_SITE_OTHER): Payer: Medicare Other | Admitting: Women's Health

## 2013-09-18 DIAGNOSIS — N898 Other specified noninflammatory disorders of vagina: Secondary | ICD-10-CM

## 2013-09-18 LAB — WET PREP FOR TRICH, YEAST, CLUE
CLUE CELLS WET PREP: NONE SEEN
TRICH WET PREP: NONE SEEN
WBC, Wet Prep HPF POC: NONE SEEN
Yeast Wet Prep HPF POC: NONE SEEN

## 2013-09-18 NOTE — Progress Notes (Signed)
Patient ID: Tracey Norris, female   DOB: [birth year], 68 y.o.   MRN: [id number] Presents with complaint of vaginal burning, pain, soreness in the vaginal area extending into the vagina. History of recurrent yeast. Treated for yeast with Diflucan with persistent yeast, was treated last week with Terazol, itching resolved now has increased irritation, burning and soreness. Breast cancer survivor uses Vagifem twice weekly with continued dryness. Denies urinary symptoms, fever. Has had problems with IBS/reflux has followup scheduled with Dr. Earlean Shawl. Struggles with anxiety.  Exam: Appears well. External genitalia extremely erythematous at introitus, speculum exam vaginal dryness with vaginal walls are erythematous minimal discharge, wet prep negative.  Vaginal irritation  Plan: Reviewed yeast has resolved, A& D. ointment to external genitalia, loose clothing, instructed to call if symptoms persist. Possibly Terazol may have caused erythema.

## 2013-09-23 DIAGNOSIS — D510 Vitamin B12 deficiency anemia due to intrinsic factor deficiency: Secondary | ICD-10-CM | POA: Diagnosis not present

## 2013-09-24 ENCOUNTER — Encounter: Payer: Self-pay | Admitting: Women's Health

## 2013-09-24 ENCOUNTER — Ambulatory Visit (INDEPENDENT_AMBULATORY_CARE_PROVIDER_SITE_OTHER): Payer: Medicare Other | Admitting: Women's Health

## 2013-09-24 DIAGNOSIS — L293 Anogenital pruritus, unspecified: Secondary | ICD-10-CM

## 2013-09-24 DIAGNOSIS — N949 Unspecified condition associated with female genital organs and menstrual cycle: Secondary | ICD-10-CM

## 2013-09-24 DIAGNOSIS — N898 Other specified noninflammatory disorders of vagina: Secondary | ICD-10-CM

## 2013-09-24 DIAGNOSIS — N9489 Other specified conditions associated with female genital organs and menstrual cycle: Secondary | ICD-10-CM

## 2013-09-24 LAB — WET PREP FOR TRICH, YEAST, CLUE
Clue Cells Wet Prep HPF POC: NONE SEEN
Trich, Wet Prep: NONE SEEN
Yeast Wet Prep HPF POC: NONE SEEN

## 2013-09-24 NOTE — Progress Notes (Signed)
Patient ID: Tracey Norris, female   DOB: 1945/05/19, 68 y.o.   MRN: [id number] Presents for test of cure wet prep. Has had recurrent yeast,  problems with vaginal burning with itching. Symptoms are not daily, currently using A&D ointment externally, Vagifem twice weekly. Has had problems with reflux/IBS currently on Zantac with increased vaginal dryness/burning. Has scheduled GI followup tomorrow.  Exam: Appears well, external genitalia within normal limits without visible erythema, speculum exam scant discharge no visible erythema, wet prep negative. Bimanual no tenderness.  Resolved yeast Reflux  Plan: Keep scheduled followup with GI.  Reassurance given about normality of exam and wet prep will continue A&D ointment externally.

## 2013-09-24 NOTE — Addendum Note (Signed)
Addended by: Catha Brow on: 09/24/2013 03:50 PM   Modules accepted: Orders

## 2013-09-25 DIAGNOSIS — R141 Gas pain: Secondary | ICD-10-CM | POA: Diagnosis not present

## 2013-10-09 ENCOUNTER — Telehealth: Payer: Self-pay

## 2013-10-09 NOTE — Telephone Encounter (Signed)
Sounds to me she needs to be seen before next week

## 2013-10-09 NOTE — Telephone Encounter (Signed)
Patient has history of vaginal pain, dryness, burning and has seen [place] recently for it.  The last few days it has been more persistant and patient said she is really uncomfortable and feels swollen inside.  She has a little itching but said mostly it just hurts.  She is concerned at this point if she may have an infection.  Patient has appointment with [place] next [place]. But now feels like she cannot wait until then.

## 2013-10-09 NOTE — Telephone Encounter (Signed)
error 

## 2013-10-09 NOTE — Telephone Encounter (Signed)
Tracey Norris will call patient and schedule her this week.

## 2013-10-10 ENCOUNTER — Encounter: Payer: Self-pay | Admitting: Gynecology

## 2013-10-10 ENCOUNTER — Ambulatory Visit (INDEPENDENT_AMBULATORY_CARE_PROVIDER_SITE_OTHER): Payer: Medicare Other | Admitting: Gynecology

## 2013-10-10 VITALS — BP 130/80

## 2013-10-10 DIAGNOSIS — N898 Other specified noninflammatory disorders of vagina: Secondary | ICD-10-CM | POA: Diagnosis not present

## 2013-10-10 DIAGNOSIS — B3731 Acute candidiasis of vulva and vagina: Secondary | ICD-10-CM

## 2013-10-10 DIAGNOSIS — N899 Noninflammatory disorder of vagina, unspecified: Secondary | ICD-10-CM | POA: Diagnosis not present

## 2013-10-10 DIAGNOSIS — R8299 Other abnormal findings in urine: Secondary | ICD-10-CM | POA: Diagnosis not present

## 2013-10-10 DIAGNOSIS — B373 Candidiasis of vulva and vagina: Secondary | ICD-10-CM

## 2013-10-10 LAB — URINALYSIS W MICROSCOPIC + REFLEX CULTURE
Bilirubin Urine: NEGATIVE
Casts: NONE SEEN
Crystals: NONE SEEN
Glucose, UA: NEGATIVE mg/dL
HGB URINE DIPSTICK: NEGATIVE
Ketones, ur: NEGATIVE mg/dL
NITRITE: NEGATIVE
PH: 5.5 (ref 5.0–8.0)
PROTEIN: NEGATIVE mg/dL
RBC / HPF: NONE SEEN RBC/hpf (ref <3)
Specific Gravity, Urine: <1.005 — ABNORMAL LOW (ref 1.005–1.030)
UROBILINOGEN UA: 0.2 mg/dL (ref 0.0–1.0)

## 2013-10-10 LAB — WET PREP FOR TRICH, YEAST, CLUE
CLUE CELLS WET PREP: NONE SEEN
TRICH WET PREP: NONE SEEN

## 2013-10-10 MED ORDER — FLUCONAZOLE 150 MG PO TABS: ORAL_TABLET | ORAL | Status: DC

## 2013-10-10 NOTE — Progress Notes (Signed)
   Patient presented to the office today with a complaint of vaginal burning sensation and has had some frequency. Patient had been seen several times this year by our nurse practitioner and has been treated for bacterial vaginosis and yeast. Patient is currently on Vagifem 10 mcg twice a week for vaginal atrophy. Patient with past history of breast cancer.  Exam: Bartholin urethra Skene glands within normal limits Vagina: No lesions or discharge Vaginal cuff: No gross lesions on inspection Bimanual exam not done  A wet prep few yeast noted  Urinalysis 11-20 WBC, few bacteria  Assessment/plan: Patient may have caused the area dictation from last prescription that she used Terazol 7. She is no longer taking it. I will call in a prescription for Diflucan 150 mg to take one by mouth today and then take one by mouth q. weekly for 3 months she was also given a sample and reminded to buy over-the-counter Replens which is a long-lasting vaginal moisturize her that she can use on the face that she does not use the Vagifem tablet. For the results of the urine culture since she is very sensitive to antibiotics. She will continued also to take a daily probiotic tablet.

## 2013-10-10 NOTE — Addendum Note (Signed)
Addended by: Thurnell Garbe A on: 10/10/2013 12:58 PM   Modules accepted: Orders

## 2013-10-10 NOTE — Patient Instructions (Signed)

## 2013-10-11 LAB — URINE CULTURE

## 2013-10-15 ENCOUNTER — Telehealth: Payer: Self-pay | Admitting: *Deleted

## 2013-10-15 ENCOUNTER — Other Ambulatory Visit: Payer: Self-pay | Admitting: Women's Health

## 2013-10-15 NOTE — Telephone Encounter (Signed)
TC stop replens, continue vagifem, external lubricant,

## 2013-10-15 NOTE — Telephone Encounter (Signed)
Pt saw Dr.Fernandez on 10/10/13 (see note) pt said still having vaginal burning and dryness. Pt used the vagifem and took Replens as directed by Dr.Fernandez but had bad burning when inserting Replens. No itching, no burning with urination. Pt did have small amount bacteria in urine. Pt would like recommendations. Please advise

## 2013-10-18 DIAGNOSIS — R233 Spontaneous ecchymoses: Secondary | ICD-10-CM | POA: Diagnosis not present

## 2013-10-18 DIAGNOSIS — L820 Inflamed seborrheic keratosis: Secondary | ICD-10-CM | POA: Diagnosis not present

## 2013-10-24 DIAGNOSIS — D510 Vitamin B12 deficiency anemia due to intrinsic factor deficiency: Secondary | ICD-10-CM | POA: Diagnosis not present

## 2013-11-05 ENCOUNTER — Ambulatory Visit (INDEPENDENT_AMBULATORY_CARE_PROVIDER_SITE_OTHER): Payer: Medicare Other | Admitting: Women's Health

## 2013-11-05 ENCOUNTER — Encounter: Payer: Self-pay | Admitting: Women's Health

## 2013-11-05 VITALS — BP 150/80 | Ht 61.0 in | Wt 127.0 lb

## 2013-11-05 DIAGNOSIS — Z01419 Encounter for gynecological examination (general) (routine) without abnormal findings: Secondary | ICD-10-CM | POA: Diagnosis not present

## 2013-11-05 DIAGNOSIS — B9689 Other specified bacterial agents as the cause of diseases classified elsewhere: Secondary | ICD-10-CM

## 2013-11-05 DIAGNOSIS — N898 Other specified noninflammatory disorders of vagina: Secondary | ICD-10-CM

## 2013-11-05 DIAGNOSIS — R8299 Other abnormal findings in urine: Secondary | ICD-10-CM | POA: Diagnosis not present

## 2013-11-05 DIAGNOSIS — N760 Acute vaginitis: Secondary | ICD-10-CM | POA: Diagnosis not present

## 2013-11-05 DIAGNOSIS — A499 Bacterial infection, unspecified: Secondary | ICD-10-CM

## 2013-11-05 DIAGNOSIS — R8290 Unspecified abnormal findings in urine: Secondary | ICD-10-CM

## 2013-11-05 LAB — WET PREP FOR TRICH, YEAST, CLUE
Clue Cells Wet Prep HPF POC: NONE SEEN
Trich, Wet Prep: NONE SEEN
Yeast Wet Prep HPF POC: NONE SEEN

## 2013-11-05 MED ORDER — ESTRADIOL 10 MCG VA TABS: ORAL_TABLET | VAGINAL | Status: DC

## 2013-11-05 MED ORDER — NYSTATIN-TRIAMCINOLONE 100000-0.1 UNIT/GM-% EX OINT: 1.0000 "application " | TOPICAL_OINTMENT | Freq: Two times a day (BID) | CUTANEOUS | Status: DC

## 2013-11-05 NOTE — Patient Instructions (Signed)
Health Recommendations for Postmenopausal Women Respected and ongoing research has looked at the most common causes of death, disability, and poor quality of life in postmenopausal women. The causes include heart disease, diseases of blood vessels, diabetes, depression, cancer, and bone loss (osteoporosis). Many things can be done to help lower the chances of developing these and other common problems. CARDIOVASCULAR DISEASE Heart Disease: A heart attack is a medical emergency. Know the signs and symptoms of a heart attack. Below are things women can do to reduce their risk for heart disease.   Do not smoke. If you smoke, quit.  Aim for a healthy weight. Being overweight causes many preventable deaths. Eat a healthy and balanced diet and drink an adequate amount of liquids.  Get moving. Make a commitment to be more physically active. Aim for 30 minutes of activity on most, if not all days of the week.  Eat for heart health. Choose a diet that is low in saturated fat and cholesterol and eliminate trans fat. Include whole grains, vegetables, and fruits. Read and understand the labels on food containers before buying.  Know your numbers. Ask your caregiver to check your blood pressure, cholesterol (total, HDL, LDL, triglycerides) and blood glucose. Work with your caregiver on improving your entire clinical picture.  High blood pressure. Limit or stop your table salt intake (try salt substitute and food seasonings). Avoid salty foods and drinks. Read labels on food containers before buying. Eating well and exercising can help control high blood pressure. STROKE  Stroke is a medical emergency. Stroke may be the result of a blood clot in a blood vessel in the brain or by a brain hemorrhage (bleeding). Know the signs and symptoms of a stroke. To lower the risk of developing a stroke:  Avoid fatty foods.  Quit smoking.  Control your diabetes, blood pressure, and irregular heart rate. THROMBOPHLEBITIS  (BLOOD CLOT) OF THE LEG  Becoming overweight and leading a stationary lifestyle may also contribute to developing blood clots. Controlling your diet and exercising will help lower the risk of developing blood clots. CANCER SCREENING  Breast Cancer: Take steps to reduce your risk of breast cancer.  You should practice "breast self-awareness." This means understanding the normal appearance and feel of your breasts and should include breast self-examination. Any changes detected, no matter how small, should be reported to your caregiver.  After age 40, you should have a clinical breast exam (CBE) every year.  Starting at age 40, you should consider having a mammogram (breast X-ray) every year.  If you have a family history of breast cancer, talk to your caregiver about genetic screening.  If you are at high risk for breast cancer, talk to your caregiver about having an MRI and a mammogram every year.  Intestinal or Stomach Cancer: Tests to consider are a rectal exam, fecal occult blood, sigmoidoscopy, and colonoscopy. Women who are high risk may need to be screened at an earlier age and more often.  Cervical Cancer:  Beginning at age 30, you should have a Pap test every 3 years as long as the past 3 Pap tests have been normal.  If you have had past treatment for cervical cancer or a condition that could lead to cancer, you need Pap tests and screening for cancer for at least 20 years after your treatment.  If you had a hysterectomy for a problem that was not cancer or a condition that could lead to cancer, then you no longer need Pap tests.    If you are between ages 65 and 70, and you have had normal Pap tests going back 10 years, you no longer need Pap tests.  If Pap tests have been discontinued, risk factors (such as a new sexual partner) need to be reassessed to determine if screening should be resumed.  Some medical problems can increase the chance of getting cervical cancer. In these  cases, your caregiver may recommend more frequent screening and Pap tests.  Uterine Cancer: If you have vaginal bleeding after reaching menopause, you should notify your caregiver.  Ovarian Cancer: Other than yearly pelvic exams, there are no reliable tests available to screen for ovarian cancer at this time except for yearly pelvic exams.  Lung Cancer: Yearly chest X-rays can detect lung cancer and should be done on high risk women, such as cigarette smokers and women with chronic lung disease (emphysema).  Skin Cancer: A complete body skin exam should be done at your yearly examination. Avoid overexposure to the sun and ultraviolet light lamps. Use a strong sun block cream when in the sun. All of these things are important for lowering the risk of skin cancer. MENOPAUSE Menopause Symptoms: Hormone therapy products are effective for treating symptoms associated with menopause:  Moderate to severe hot flashes.  Night sweats.  Mood swings.  Headaches.  Tiredness.  Loss of sex drive.  Insomnia.  Other symptoms. Hormone replacement carries certain risks, especially in older women. Women who use or are thinking about using estrogen or estrogen with progestin treatments should discuss that with their caregiver. Your caregiver will help you understand the benefits and risks. The ideal dose of hormone replacement therapy is not known. The Food and Drug Administration (FDA) has concluded that hormone therapy should be used only at the lowest doses and for the shortest amount of time to reach treatment goals.  OSTEOPOROSIS Protecting Against Bone Loss and Preventing Fracture If you use hormone therapy for prevention of bone loss (osteoporosis), the risks for bone loss must outweigh the risk of the therapy. Ask your caregiver about other medications known to be safe and effective for preventing bone loss and fractures. To guard against bone loss or fractures, the following is recommended:  If  you are younger than age 50, take 1000 mg of calcium and at least 600 mg of Vitamin D per day.  If you are older than age 50 but younger than age 70, take 1200 mg of calcium and at least 600 mg of Vitamin D per day.  If you are older than age 70, take 1200 mg of calcium and at least 800 mg of Vitamin D per day. Smoking and excessive alcohol intake increases the risk of osteoporosis. Eat foods rich in calcium and vitamin D and do weight bearing exercises several times a week as your caregiver suggests. DIABETES Diabetes Mellitus: If you have type I or type 2 diabetes, you should keep your blood sugar under control with diet, exercise, and recommended medication. Avoid starchy and fatty foods, and too many sweets. Being overweight can make diabetes control more difficult. COGNITION AND MEMORY Cognition and Memory: Menopausal hormone therapy is not recommended for the prevention of cognitive disorders such as Alzheimer's disease or memory loss.  DEPRESSION  Depression may occur at any age, but it is common in elderly women. This may be because of physical, medical, social (loneliness), or financial problems and needs. If you are experiencing depression because of medical problems and control of symptoms, talk to your caregiver about this. Physical   activity and exercise may help with mood and sleep. Community and volunteer involvement may improve your sense of value and worth. If you have depression and you feel that the problem is getting worse or becoming severe, talk to your caregiver about which treatment options are best for you. ACCIDENTS  Accidents are common and can be serious in elderly woman. Prepare your house to prevent accidents. Eliminate throw rugs, place hand bars in bath, shower, and toilet areas. Avoid wearing high heeled shoes or walking on wet, snowy, and icy areas. Limit or stop driving if you have vision or hearing problems, or if you feel you are unsteady with your movements and  reflexes. HEPATITIS C Hepatitis C is a type of viral infection affecting the liver. It is spread mainly through contact with blood from an infected person. It can be treated, but if left untreated, it can lead to severe liver damage over the years. Many people who are infected do not know that the virus is in their blood. If you are a "baby-boomer", it is recommended that you have one screening test for Hepatitis C. IMMUNIZATIONS  Several immunizations are important to consider having during your senior years, including:   Tetanus, diphtheria, and pertussis booster shot.  Influenza every year before the flu season begins.  Pneumonia vaccine.  Shingles vaccine.  Others, as indicated based on your specific needs. Talk to your caregiver about these. Document Released: 02/18/2005 Document Revised: 05/13/2013 Document Reviewed: 10/15/2007 Exit[employer] Patient Information 2015 [employer]. This information is not intended to replace advice given to you by your health care provider. Make sure you discuss any questions you have with your health care provider.  

## 2013-11-05 NOTE — Progress Notes (Signed)
Raechelle Sarti 1945-10-10 [id number]    History:    Presents for breast and pelvic exam. TVH for DUB.Marland Kitchen Normal Pap history. Has had problems with recurrent vaginal burning, yeast and dyspareunia. Left breast cancer at age 68, 53, reconstruction with implant 2007, not estrogen dependent, has been contemplating BRCA testing. 08/2013 Persistent simple cyst right breast on ultrasound.  Mother breast cancer age 72 died at age 9. Hypertension and hypercholesterolemia managed by primary care. 2010 negative colonoscopy. Numerous GI issues IBS/GERD has follow-up scheduled Dr. Watt Climes. Current on immunizations. DEXA through primary care.  Past medical history, past surgical history, family history and social history were all reviewed and documented in the EPIC chart. Retired, son and daughter both doing well.  ROS:  A  12 point ROS was performed and pertinent positives and negatives are included.  Exam:  Filed Vitals:   11/05/13 1431  BP: 150/80    General appearance:  Normal Thyroid:  Symmetrical, normal in size, without palpable masses or nodularity. Respiratory  Auscultation:  Clear without wheezing or rhonchi Cardiovascular  Auscultation:  Regular rate, without rubs, murmurs or gallops  Edema/varicosities:  Not grossly evident Abdominal  Soft,nontender, without masses, guarding or rebound.  Liver/spleen:  No organomegaly noted  Hernia:  None appreciated  Skin  Inspection:  Grossly normal   Breasts: Examined lying and sitting.     Right: Reduction well-healed scars Without masses, retractions, discharge or axillary adenopathy.     Left: Mastectomy/Implant Gentitourinary   Inguinal/mons:  Normal without inguinal adenopathy  External genitalia:  Normal  BUS/Urethra/Skene's glands:  Normal  Vagina:  Atrophic wet prep negative  Cervix: Absent Uterus: Absent  Adnexa/parametria:     Rt: Without masses or tenderness.   Lt: Without masses or tenderness.  Anus and  perineum: Normal  Digital rectal exam: Normal sphincter tone without palpated masses or tenderness  Assessment/Plan:  68 y.o. MWF G2P2 for breast and pelvic exam with complaint of vaginal burning.   TVH on Vagifem Left breast cancer mastectomy with implant Right breast simple cyst Hypertension/hypercholesterolemia-primary care manages labs and meds  Plan: Vagifem prescription, proper use given and reviewed slight systemic absorption, will continue twice weekly, reviewed normality of exam and wet prep. SBE's, continue annual mammogram, 3-D tomography reviewed and encouraged, right breast ultrasound as recommended in 6 months. Continue regular exercise, calcium rich diet, vitamin D 1000 daily encouraged. Instructed to have vitamin D level checked when labs drawn next. Repeat DEXA at primary care. Home safety, fall prevention and importance of regular exercise reviewed.    Huel Cote [facility], 3:18 PM 11/05/2013

## 2013-11-06 LAB — URINALYSIS W MICROSCOPIC + REFLEX CULTURE
Bacteria, UA: NONE SEEN
Bilirubin Urine: NEGATIVE
Casts: NONE SEEN
Crystals: NONE SEEN
Glucose, UA: NEGATIVE mg/dL
Hgb urine dipstick: NEGATIVE
Ketones, ur: NEGATIVE mg/dL
Nitrite: NEGATIVE
PROTEIN: NEGATIVE mg/dL
SQUAMOUS EPITHELIAL / LPF: NONE SEEN
Specific Gravity, Urine: 1.015 (ref 1.005–1.030)
UROBILINOGEN UA: 0.2 mg/dL (ref 0.0–1.0)
pH: 5.5 (ref 5.0–8.0)

## 2013-11-07 LAB — URINE CULTURE: Colony Count: 10000

## 2013-11-11 ENCOUNTER — Encounter: Payer: Self-pay | Admitting: Women's Health

## 2013-11-11 DIAGNOSIS — K219 Gastro-esophageal reflux disease without esophagitis: Secondary | ICD-10-CM | POA: Diagnosis not present

## 2013-11-28 DIAGNOSIS — D510 Vitamin B12 deficiency anemia due to intrinsic factor deficiency: Secondary | ICD-10-CM | POA: Diagnosis not present

## 2013-12-30 DIAGNOSIS — D510 Vitamin B12 deficiency anemia due to intrinsic factor deficiency: Secondary | ICD-10-CM | POA: Diagnosis not present

## 2013-12-31 DIAGNOSIS — E785 Hyperlipidemia, unspecified: Secondary | ICD-10-CM | POA: Diagnosis not present

## 2013-12-31 DIAGNOSIS — M19041 Primary osteoarthritis, right hand: Secondary | ICD-10-CM | POA: Diagnosis not present

## 2013-12-31 DIAGNOSIS — Z6823 Body mass index (BMI) 23.0-23.9, adult: Secondary | ICD-10-CM | POA: Diagnosis not present

## 2013-12-31 DIAGNOSIS — E559 Vitamin D deficiency, unspecified: Secondary | ICD-10-CM | POA: Diagnosis not present

## 2013-12-31 DIAGNOSIS — M19042 Primary osteoarthritis, left hand: Secondary | ICD-10-CM | POA: Diagnosis not present

## 2013-12-31 DIAGNOSIS — R7309 Other abnormal glucose: Secondary | ICD-10-CM | POA: Diagnosis not present

## 2013-12-31 DIAGNOSIS — E538 Deficiency of other specified B group vitamins: Secondary | ICD-10-CM | POA: Diagnosis not present

## 2013-12-31 DIAGNOSIS — I10 Essential (primary) hypertension: Secondary | ICD-10-CM | POA: Diagnosis not present

## 2014-01-16 DIAGNOSIS — Z9882 Breast implant status: Secondary | ICD-10-CM | POA: Diagnosis not present

## 2014-01-28 DIAGNOSIS — H5089 Other specified strabismus: Secondary | ICD-10-CM | POA: Diagnosis not present

## 2014-01-30 DIAGNOSIS — D510 Vitamin B12 deficiency anemia due to intrinsic factor deficiency: Secondary | ICD-10-CM | POA: Diagnosis not present

## 2014-02-04 DIAGNOSIS — M19041 Primary osteoarthritis, right hand: Secondary | ICD-10-CM | POA: Diagnosis not present

## 2014-02-04 DIAGNOSIS — Z6823 Body mass index (BMI) 23.0-23.9, adult: Secondary | ICD-10-CM | POA: Diagnosis not present

## 2014-02-04 DIAGNOSIS — M1990 Unspecified osteoarthritis, unspecified site: Secondary | ICD-10-CM | POA: Diagnosis not present

## 2014-02-04 DIAGNOSIS — E538 Deficiency of other specified B group vitamins: Secondary | ICD-10-CM | POA: Diagnosis not present

## 2014-02-04 DIAGNOSIS — M19042 Primary osteoarthritis, left hand: Secondary | ICD-10-CM | POA: Diagnosis not present

## 2014-02-12 DIAGNOSIS — M6281 Muscle weakness (generalized): Secondary | ICD-10-CM | POA: Diagnosis not present

## 2014-02-12 DIAGNOSIS — M25642 Stiffness of left hand, not elsewhere classified: Secondary | ICD-10-CM | POA: Diagnosis not present

## 2014-02-12 DIAGNOSIS — M25641 Stiffness of right hand, not elsewhere classified: Secondary | ICD-10-CM | POA: Diagnosis not present

## 2014-02-12 DIAGNOSIS — M79642 Pain in left hand: Secondary | ICD-10-CM | POA: Diagnosis not present

## 2014-02-17 DIAGNOSIS — M25642 Stiffness of left hand, not elsewhere classified: Secondary | ICD-10-CM | POA: Diagnosis not present

## 2014-02-17 DIAGNOSIS — M6281 Muscle weakness (generalized): Secondary | ICD-10-CM | POA: Diagnosis not present

## 2014-02-17 DIAGNOSIS — M79642 Pain in left hand: Secondary | ICD-10-CM | POA: Diagnosis not present

## 2014-02-17 DIAGNOSIS — M25641 Stiffness of right hand, not elsewhere classified: Secondary | ICD-10-CM | POA: Diagnosis not present

## 2014-02-21 DIAGNOSIS — M79642 Pain in left hand: Secondary | ICD-10-CM | POA: Diagnosis not present

## 2014-02-21 DIAGNOSIS — M25642 Stiffness of left hand, not elsewhere classified: Secondary | ICD-10-CM | POA: Diagnosis not present

## 2014-02-21 DIAGNOSIS — M6281 Muscle weakness (generalized): Secondary | ICD-10-CM | POA: Diagnosis not present

## 2014-02-21 DIAGNOSIS — M25641 Stiffness of right hand, not elsewhere classified: Secondary | ICD-10-CM | POA: Diagnosis not present

## 2014-02-24 DIAGNOSIS — M25641 Stiffness of right hand, not elsewhere classified: Secondary | ICD-10-CM | POA: Diagnosis not present

## 2014-02-24 DIAGNOSIS — M79642 Pain in left hand: Secondary | ICD-10-CM | POA: Diagnosis not present

## 2014-02-24 DIAGNOSIS — M6281 Muscle weakness (generalized): Secondary | ICD-10-CM | POA: Diagnosis not present

## 2014-02-24 DIAGNOSIS — M25642 Stiffness of left hand, not elsewhere classified: Secondary | ICD-10-CM | POA: Diagnosis not present

## 2014-02-28 DIAGNOSIS — M79642 Pain in left hand: Secondary | ICD-10-CM | POA: Diagnosis not present

## 2014-02-28 DIAGNOSIS — M25641 Stiffness of right hand, not elsewhere classified: Secondary | ICD-10-CM | POA: Diagnosis not present

## 2014-02-28 DIAGNOSIS — M6281 Muscle weakness (generalized): Secondary | ICD-10-CM | POA: Diagnosis not present

## 2014-02-28 DIAGNOSIS — M25642 Stiffness of left hand, not elsewhere classified: Secondary | ICD-10-CM | POA: Diagnosis not present

## 2014-03-03 DIAGNOSIS — D510 Vitamin B12 deficiency anemia due to intrinsic factor deficiency: Secondary | ICD-10-CM | POA: Diagnosis not present

## 2014-03-05 DIAGNOSIS — M25641 Stiffness of right hand, not elsewhere classified: Secondary | ICD-10-CM | POA: Diagnosis not present

## 2014-03-05 DIAGNOSIS — M79642 Pain in left hand: Secondary | ICD-10-CM | POA: Diagnosis not present

## 2014-03-05 DIAGNOSIS — M6281 Muscle weakness (generalized): Secondary | ICD-10-CM | POA: Diagnosis not present

## 2014-03-05 DIAGNOSIS — M25642 Stiffness of left hand, not elsewhere classified: Secondary | ICD-10-CM | POA: Diagnosis not present

## 2014-03-10 DIAGNOSIS — M25642 Stiffness of left hand, not elsewhere classified: Secondary | ICD-10-CM | POA: Diagnosis not present

## 2014-03-10 DIAGNOSIS — M6281 Muscle weakness (generalized): Secondary | ICD-10-CM | POA: Diagnosis not present

## 2014-03-10 DIAGNOSIS — M79642 Pain in left hand: Secondary | ICD-10-CM | POA: Diagnosis not present

## 2014-03-10 DIAGNOSIS — M25641 Stiffness of right hand, not elsewhere classified: Secondary | ICD-10-CM | POA: Diagnosis not present

## 2014-03-19 DIAGNOSIS — M6281 Muscle weakness (generalized): Secondary | ICD-10-CM | POA: Diagnosis not present

## 2014-03-19 DIAGNOSIS — M25641 Stiffness of right hand, not elsewhere classified: Secondary | ICD-10-CM | POA: Diagnosis not present

## 2014-03-19 DIAGNOSIS — M25642 Stiffness of left hand, not elsewhere classified: Secondary | ICD-10-CM | POA: Diagnosis not present

## 2014-03-19 DIAGNOSIS — M79642 Pain in left hand: Secondary | ICD-10-CM | POA: Diagnosis not present

## 2014-03-21 DIAGNOSIS — M6281 Muscle weakness (generalized): Secondary | ICD-10-CM | POA: Diagnosis not present

## 2014-03-21 DIAGNOSIS — M25642 Stiffness of left hand, not elsewhere classified: Secondary | ICD-10-CM | POA: Diagnosis not present

## 2014-03-21 DIAGNOSIS — M25641 Stiffness of right hand, not elsewhere classified: Secondary | ICD-10-CM | POA: Diagnosis not present

## 2014-03-21 DIAGNOSIS — M79642 Pain in left hand: Secondary | ICD-10-CM | POA: Diagnosis not present

## 2014-03-24 DIAGNOSIS — M25642 Stiffness of left hand, not elsewhere classified: Secondary | ICD-10-CM | POA: Diagnosis not present

## 2014-03-24 DIAGNOSIS — M6281 Muscle weakness (generalized): Secondary | ICD-10-CM | POA: Diagnosis not present

## 2014-03-24 DIAGNOSIS — M79642 Pain in left hand: Secondary | ICD-10-CM | POA: Diagnosis not present

## 2014-03-24 DIAGNOSIS — M25641 Stiffness of right hand, not elsewhere classified: Secondary | ICD-10-CM | POA: Diagnosis not present

## 2014-04-02 DIAGNOSIS — M25642 Stiffness of left hand, not elsewhere classified: Secondary | ICD-10-CM | POA: Diagnosis not present

## 2014-04-02 DIAGNOSIS — M79642 Pain in left hand: Secondary | ICD-10-CM | POA: Diagnosis not present

## 2014-04-02 DIAGNOSIS — M6281 Muscle weakness (generalized): Secondary | ICD-10-CM | POA: Diagnosis not present

## 2014-04-02 DIAGNOSIS — M25641 Stiffness of right hand, not elsewhere classified: Secondary | ICD-10-CM | POA: Diagnosis not present

## 2014-04-03 DIAGNOSIS — M25642 Stiffness of left hand, not elsewhere classified: Secondary | ICD-10-CM | POA: Diagnosis not present

## 2014-04-03 DIAGNOSIS — M6281 Muscle weakness (generalized): Secondary | ICD-10-CM | POA: Diagnosis not present

## 2014-04-03 DIAGNOSIS — E538 Deficiency of other specified B group vitamins: Secondary | ICD-10-CM | POA: Diagnosis not present

## 2014-04-03 DIAGNOSIS — M25641 Stiffness of right hand, not elsewhere classified: Secondary | ICD-10-CM | POA: Diagnosis not present

## 2014-04-03 DIAGNOSIS — M79642 Pain in left hand: Secondary | ICD-10-CM | POA: Diagnosis not present

## 2014-05-05 DIAGNOSIS — D510 Vitamin B12 deficiency anemia due to intrinsic factor deficiency: Secondary | ICD-10-CM | POA: Diagnosis not present

## 2014-06-06 DIAGNOSIS — E538 Deficiency of other specified B group vitamins: Secondary | ICD-10-CM | POA: Diagnosis not present

## 2014-07-07 DIAGNOSIS — D510 Vitamin B12 deficiency anemia due to intrinsic factor deficiency: Secondary | ICD-10-CM | POA: Diagnosis not present

## 2014-08-08 DIAGNOSIS — D510 Vitamin B12 deficiency anemia due to intrinsic factor deficiency: Secondary | ICD-10-CM | POA: Diagnosis not present

## 2014-09-08 DIAGNOSIS — E538 Deficiency of other specified B group vitamins: Secondary | ICD-10-CM | POA: Diagnosis not present

## 2014-09-17 ENCOUNTER — Telehealth: Payer: Self-pay | Admitting: *Deleted

## 2014-09-17 NOTE — Telephone Encounter (Signed)
(  Pt aware you are of the office) pt called requesting order for diag.mammogram faxed to [facility] for appointment 09/29/14. History of breast cancer, notes and recent mammogram report states mammogram screening. Okay to proceed with diag. Mammogram order? Please advise

## 2014-09-21 NOTE — Telephone Encounter (Signed)
[facility] for diagnostic, review that she should call insurance to check coverage.  Should always request and get the 3D - it is now covered by medicare.

## 2014-09-23 NOTE — Telephone Encounter (Signed)
Pt aware order will be signed and faxed to [facility] pt will call to schedule as she is out of town

## 2014-09-29 DIAGNOSIS — Z853 Personal history of malignant neoplasm of breast: Secondary | ICD-10-CM | POA: Diagnosis not present

## 2014-09-30 ENCOUNTER — Encounter: Payer: Self-pay | Admitting: Women's Health

## 2014-10-09 DIAGNOSIS — E538 Deficiency of other specified B group vitamins: Secondary | ICD-10-CM | POA: Diagnosis not present

## 2014-11-10 DIAGNOSIS — E538 Deficiency of other specified B group vitamins: Secondary | ICD-10-CM | POA: Diagnosis not present

## 2014-11-10 DIAGNOSIS — Z23 Encounter for immunization: Secondary | ICD-10-CM | POA: Diagnosis not present

## 2014-11-24 ENCOUNTER — Other Ambulatory Visit: Payer: Self-pay | Admitting: *Deleted

## 2014-11-24 DIAGNOSIS — N760 Acute vaginitis: Principal | ICD-10-CM

## 2014-11-24 DIAGNOSIS — B9689 Other specified bacterial agents as the cause of diseases classified elsewhere: Secondary | ICD-10-CM

## 2014-11-24 MED ORDER — ESTRADIOL 10 MCG VA TABS: ORAL_TABLET | VAGINAL | Status: DC

## 2014-12-12 DIAGNOSIS — E559 Vitamin D deficiency, unspecified: Secondary | ICD-10-CM | POA: Diagnosis not present

## 2014-12-12 DIAGNOSIS — D510 Vitamin B12 deficiency anemia due to intrinsic factor deficiency: Secondary | ICD-10-CM | POA: Diagnosis not present

## 2014-12-12 DIAGNOSIS — I10 Essential (primary) hypertension: Secondary | ICD-10-CM | POA: Diagnosis not present

## 2014-12-12 DIAGNOSIS — Z9181 History of falling: Secondary | ICD-10-CM | POA: Diagnosis not present

## 2014-12-12 DIAGNOSIS — F419 Anxiety disorder, unspecified: Secondary | ICD-10-CM | POA: Diagnosis not present

## 2014-12-12 DIAGNOSIS — R7301 Impaired fasting glucose: Secondary | ICD-10-CM | POA: Diagnosis not present

## 2014-12-12 DIAGNOSIS — E785 Hyperlipidemia, unspecified: Secondary | ICD-10-CM | POA: Diagnosis not present

## 2014-12-12 DIAGNOSIS — Z23 Encounter for immunization: Secondary | ICD-10-CM | POA: Diagnosis not present

## 2015-01-13 DIAGNOSIS — D519 Vitamin B12 deficiency anemia, unspecified: Secondary | ICD-10-CM | POA: Diagnosis not present

## 2015-02-03 DIAGNOSIS — Z853 Personal history of malignant neoplasm of breast: Secondary | ICD-10-CM | POA: Diagnosis not present

## 2015-02-03 DIAGNOSIS — N651 Disproportion of reconstructed breast: Secondary | ICD-10-CM | POA: Diagnosis not present

## 2015-02-13 DIAGNOSIS — D510 Vitamin B12 deficiency anemia due to intrinsic factor deficiency: Secondary | ICD-10-CM | POA: Diagnosis not present

## 2015-03-16 DIAGNOSIS — D510 Vitamin B12 deficiency anemia due to intrinsic factor deficiency: Secondary | ICD-10-CM | POA: Diagnosis not present

## 2015-04-17 DIAGNOSIS — D510 Vitamin B12 deficiency anemia due to intrinsic factor deficiency: Secondary | ICD-10-CM | POA: Diagnosis not present

## 2015-05-07 ENCOUNTER — Encounter: Payer: Self-pay | Admitting: Women's Health

## 2015-05-07 ENCOUNTER — Ambulatory Visit (INDEPENDENT_AMBULATORY_CARE_PROVIDER_SITE_OTHER): Payer: Medicare Other | Admitting: Women's Health

## 2015-05-07 VITALS — BP 122/80 | Ht 61.0 in | Wt 120.0 lb

## 2015-05-07 DIAGNOSIS — A499 Bacterial infection, unspecified: Secondary | ICD-10-CM

## 2015-05-07 DIAGNOSIS — B9689 Other specified bacterial agents as the cause of diseases classified elsewhere: Secondary | ICD-10-CM

## 2015-05-07 DIAGNOSIS — N760 Acute vaginitis: Secondary | ICD-10-CM | POA: Diagnosis not present

## 2015-05-07 MED ORDER — ESTRADIOL 10 MCG VA TABS: ORAL_TABLET | VAGINAL | Status: DC

## 2015-05-07 NOTE — Progress Notes (Signed)
Patient ID: Tracey Norris, female   DOB: [birth year], 70 y.o.   MRN: [id number] Presents to discuss vaginal dryness and Vagifem. History of TVH for DUB and has been on Vagifem for several years uses one to 2 vaginal inserts weekly, approved by oncologist. 1985 left breast cancer, mastectomy has had reconstructive surgery and has done well. Hypertension managed by primary care. Long-term history of IBS. Denies vaginal discharge, abdominal pain or urinary symptoms.  Exam: Breast exam and sitting and lying position right breast with reduction without palpable nodules visible dimpling retractions or nipple discharge. External genitalia within normal limits, speculum exam vaginal  atrophy, no discharge or irritation noted. Bimanual no adnexal fullness or tenderness. Rectal exam confirmatory  Vaginal atrophy with history of left breast cancer  Plan: Vagifem 1 applicator at bedtime 1-2 times weekly, prescription, proper use given and reviewed minimal systemic absorption, instructed to call if continued problems. Vaginal lubricants with intercourse.

## 2015-05-07 NOTE — Patient Instructions (Signed)
Smoking Cessation, Tips for Success If you are ready to quit smoking, congratulations! You have chosen to help yourself be healthier. Cigarettes bring nicotine, tar, carbon monoxide, and other irritants into your body. Your lungs, heart, and blood vessels will be able to work better without these poisons. There are many different ways to quit smoking. Nicotine gum, nicotine patches, a nicotine inhaler, or nicotine nasal spray can help with physical craving. Hypnosis, support groups, and medicines help break the habit of smoking. WHAT THINGS CAN I DO TO MAKE QUITTING EASIER?  Here are some tips to help you quit for good:  Pick a date when you will quit smoking completely. Tell all of your friends and family about your plan to quit on that date.  Do not try to slowly cut down on the number of cigarettes you are smoking. Pick a quit date and quit smoking completely starting on that day.  Throw away all cigarettes.   Clean and remove all ashtrays from your home, work, and car.  On a card, write down your reasons for quitting. Carry the card with you and read it when you get the urge to smoke.  Cleanse your body of nicotine. Drink enough water and fluids to keep your urine clear or pale yellow. Do this after quitting to flush the nicotine from your body.  Learn to predict your moods. Do not let a bad situation be your excuse to have a cigarette. Some situations in your life might tempt you into wanting a cigarette.  Never have "just one" cigarette. It leads to wanting another and another. Remind yourself of your decision to quit.  Change habits associated with smoking. If you smoked while driving or when feeling stressed, try other activities to replace smoking. Stand up when drinking your coffee. Brush your teeth after eating. Sit in a different chair when you read the paper. Avoid alcohol while trying to quit, and try to drink fewer caffeinated beverages. Alcohol and caffeine may urge you to  smoke.  Avoid foods and drinks that can trigger a desire to smoke, such as sugary or spicy foods and alcohol.  Ask people who smoke not to smoke around you.  Have something planned to do right after eating or having a cup of coffee. For example, plan to take a walk or exercise.  Try a relaxation exercise to calm you down and decrease your stress. Remember, you may be tense and nervous for the first 2 weeks after you quit, but this will pass.  Find new activities to keep your hands busy. Play with a pen, coin, or rubber band. Doodle or draw things on paper.  Brush your teeth right after eating. This will help cut down on the craving for the taste of tobacco after meals. You can also try mouthwash.   Use oral substitutes in place of cigarettes. Try using lemon drops, carrots, cinnamon sticks, or chewing gum. Keep them handy so they are available when you have the urge to smoke.  When you have the urge to smoke, try deep breathing.  Designate your home as a nonsmoking area.  If you are a heavy smoker, ask your health care provider about a prescription for nicotine chewing gum. It can ease your withdrawal from nicotine.  Reward yourself. Set aside the cigarette money you save and buy yourself something nice.  Look for support from others. Join a support group or smoking cessation program. Ask someone at home or at work to help you with your plan   to quit smoking.  Always ask yourself, "Do I need this cigarette or is this just a reflex?" Tell yourself, "Today, I choose not to smoke," or "I do not want to smoke." You are reminding yourself of your decision to quit.  Do not replace cigarette smoking with electronic cigarettes (commonly called e-cigarettes). The safety of e-cigarettes is unknown, and some may contain harmful chemicals.  If you relapse, do not give up! Plan ahead and think about what you will do the next time you get the urge to smoke. HOW WILL I FEEL WHEN I QUIT SMOKING? You  may have symptoms of withdrawal because your body is used to nicotine (the addictive substance in cigarettes). You may crave cigarettes, be irritable, feel very hungry, cough often, get headaches, or have difficulty concentrating. The withdrawal symptoms are only temporary. They are strongest when you first quit but will go away within 10-14 days. When withdrawal symptoms occur, stay in control. Think about your reasons for quitting. Remind yourself that these are signs that your body is healing and getting used to being without cigarettes. Remember that withdrawal symptoms are easier to treat than the major diseases that smoking can cause.  Even after the withdrawal is over, expect periodic urges to smoke. However, these cravings are generally short lived and will go away whether you smoke or not. Do not smoke! WHAT RESOURCES ARE AVAILABLE TO HELP ME QUIT SMOKING? Your health care provider can direct you to community resources or hospitals for support, which may include:  Group support.  Education.  Hypnosis.  Therapy.   This information is not intended to replace advice given to you by your health care provider. Make sure you discuss any questions you have with your health care provider.   Document Released: 09/25/2003 Document Revised: 01/17/2014 Document Reviewed: 06/14/2012 Elsevier Interactive Patient Education 2016 Elsevier Inc.  

## 2015-05-12 ENCOUNTER — Other Ambulatory Visit: Payer: Self-pay

## 2015-05-18 DIAGNOSIS — D510 Vitamin B12 deficiency anemia due to intrinsic factor deficiency: Secondary | ICD-10-CM | POA: Diagnosis not present

## 2015-05-25 DIAGNOSIS — L570 Actinic keratosis: Secondary | ICD-10-CM | POA: Diagnosis not present

## 2015-06-19 DIAGNOSIS — I10 Essential (primary) hypertension: Secondary | ICD-10-CM | POA: Diagnosis not present

## 2015-06-19 DIAGNOSIS — Z1389 Encounter for screening for other disorder: Secondary | ICD-10-CM | POA: Diagnosis not present

## 2015-06-19 DIAGNOSIS — Z6822 Body mass index (BMI) 22.0-22.9, adult: Secondary | ICD-10-CM | POA: Diagnosis not present

## 2015-06-19 DIAGNOSIS — R7301 Impaired fasting glucose: Secondary | ICD-10-CM | POA: Diagnosis not present

## 2015-06-19 DIAGNOSIS — D510 Vitamin B12 deficiency anemia due to intrinsic factor deficiency: Secondary | ICD-10-CM | POA: Diagnosis not present

## 2015-06-19 DIAGNOSIS — E785 Hyperlipidemia, unspecified: Secondary | ICD-10-CM | POA: Diagnosis not present

## 2015-06-19 DIAGNOSIS — E559 Vitamin D deficiency, unspecified: Secondary | ICD-10-CM | POA: Diagnosis not present

## 2015-06-19 DIAGNOSIS — F429 Obsessive-compulsive disorder, unspecified: Secondary | ICD-10-CM | POA: Diagnosis not present

## 2015-07-20 DIAGNOSIS — D510 Vitamin B12 deficiency anemia due to intrinsic factor deficiency: Secondary | ICD-10-CM | POA: Diagnosis not present

## 2015-07-29 ENCOUNTER — Ambulatory Visit (INDEPENDENT_AMBULATORY_CARE_PROVIDER_SITE_OTHER): Payer: Medicare Other | Admitting: Women's Health

## 2015-07-29 ENCOUNTER — Encounter: Payer: Self-pay | Admitting: Women's Health

## 2015-07-29 VITALS — Ht 61.0 in | Wt 120.0 lb

## 2015-07-29 DIAGNOSIS — N898 Other specified noninflammatory disorders of vagina: Secondary | ICD-10-CM

## 2015-07-29 DIAGNOSIS — A499 Bacterial infection, unspecified: Secondary | ICD-10-CM | POA: Diagnosis not present

## 2015-07-29 DIAGNOSIS — R350 Frequency of micturition: Secondary | ICD-10-CM | POA: Diagnosis not present

## 2015-07-29 DIAGNOSIS — B9689 Other specified bacterial agents as the cause of diseases classified elsewhere: Secondary | ICD-10-CM

## 2015-07-29 DIAGNOSIS — N760 Acute vaginitis: Secondary | ICD-10-CM | POA: Diagnosis not present

## 2015-07-29 LAB — URINALYSIS W MICROSCOPIC + REFLEX CULTURE
Bilirubin Urine: NEGATIVE
Casts: NONE SEEN [LPF]
Crystals: NONE SEEN [HPF]
Glucose, UA: NEGATIVE
KETONES UR: NEGATIVE
NITRITE: NEGATIVE
Protein, ur: NEGATIVE
Specific Gravity, Urine: <1.005 (ref 1.001–1.035)
YEAST: NONE SEEN [HPF]
pH: 6 (ref 5.0–8.0)

## 2015-07-29 LAB — WET PREP FOR TRICH, YEAST, CLUE
Clue Cells Wet Prep HPF POC: NONE SEEN
TRICH WET PREP: NONE SEEN
YEAST WET PREP: NONE SEEN

## 2015-07-29 MED ORDER — ESTRADIOL 10 MCG VA TABS: ORAL_TABLET | VAGINAL | Status: DC

## 2015-07-29 MED ORDER — FLUCONAZOLE 150 MG PO TABS: 150.0000 mg | ORAL_TABLET | Freq: Once | ORAL | Status: DC

## 2015-07-29 NOTE — Progress Notes (Signed)
Patient ID: Tracey Norris, female   DOB: 17-Jun-1945, 70 y.o.   MRN: [id number] Presents with complaint of vaginal irritation with mild itching,  had a problem with recurrent yeast in the past. History of IBS. TVH for DUB, 1985 left breast cancer with mastectomy cancer free. Has used Vagifem twice weekly for dryness. Mild urinary frequency, denies pain or burning with urination or fever. IBS has difficulty with antibiotics. Leaving on vacation on Friday and wanted to make sure everything was okay. Has had increased stress over the last few weeks with sister and brother-in-law in the hospital. Has a swimming pool at her home and has been swimming daily.  Exam: Appears well. External genitalia within normal limits, no visible irritation, discharge, wet prep negative. Bimanual no tenderness. UA: Trace blood, +2 leukocytes, 10-20 WBCs, 0-2 RBCs, few bacteria.  Vaginal irritation Urinary frequency  Plan: Diflucan 150 times one dose if itching persists. Reviewed normality of exam and wet prep. Urine culture pending., Reviewed importance of increasing water, loose clothing and changing out of wet bathing suit after swimming.

## 2015-07-31 LAB — URINE CULTURE: Organism ID, Bacteria: NO GROWTH

## 2015-08-21 DIAGNOSIS — D510 Vitamin B12 deficiency anemia due to intrinsic factor deficiency: Secondary | ICD-10-CM | POA: Diagnosis not present

## 2015-09-10 DIAGNOSIS — H5203 Hypermetropia, bilateral: Secondary | ICD-10-CM | POA: Diagnosis not present

## 2015-09-22 DIAGNOSIS — D510 Vitamin B12 deficiency anemia due to intrinsic factor deficiency: Secondary | ICD-10-CM | POA: Diagnosis not present

## 2015-09-30 ENCOUNTER — Encounter: Payer: Self-pay | Admitting: Women's Health

## 2015-09-30 DIAGNOSIS — Z1231 Encounter for screening mammogram for malignant neoplasm of breast: Secondary | ICD-10-CM | POA: Diagnosis not present

## 2015-09-30 DIAGNOSIS — Z853 Personal history of malignant neoplasm of breast: Secondary | ICD-10-CM | POA: Diagnosis not present

## 2015-10-26 DIAGNOSIS — D510 Vitamin B12 deficiency anemia due to intrinsic factor deficiency: Secondary | ICD-10-CM | POA: Diagnosis not present

## 2015-10-26 DIAGNOSIS — Z23 Encounter for immunization: Secondary | ICD-10-CM | POA: Diagnosis not present

## 2015-11-27 DIAGNOSIS — D510 Vitamin B12 deficiency anemia due to intrinsic factor deficiency: Secondary | ICD-10-CM | POA: Diagnosis not present

## 2015-12-28 DIAGNOSIS — D510 Vitamin B12 deficiency anemia due to intrinsic factor deficiency: Secondary | ICD-10-CM | POA: Diagnosis not present

## 2016-02-03 DIAGNOSIS — D510 Vitamin B12 deficiency anemia due to intrinsic factor deficiency: Secondary | ICD-10-CM | POA: Diagnosis not present

## 2016-02-04 DIAGNOSIS — Z853 Personal history of malignant neoplasm of breast: Secondary | ICD-10-CM | POA: Diagnosis not present

## 2016-02-05 DIAGNOSIS — Z6822 Body mass index (BMI) 22.0-22.9, adult: Secondary | ICD-10-CM | POA: Diagnosis not present

## 2016-02-05 DIAGNOSIS — F419 Anxiety disorder, unspecified: Secondary | ICD-10-CM | POA: Diagnosis not present

## 2016-02-10 DIAGNOSIS — J019 Acute sinusitis, unspecified: Secondary | ICD-10-CM | POA: Diagnosis not present

## 2016-02-10 DIAGNOSIS — J101 Influenza due to other identified influenza virus with other respiratory manifestations: Secondary | ICD-10-CM | POA: Diagnosis not present

## 2016-03-09 DIAGNOSIS — D510 Vitamin B12 deficiency anemia due to intrinsic factor deficiency: Secondary | ICD-10-CM | POA: Diagnosis not present

## 2016-04-07 DIAGNOSIS — D510 Vitamin B12 deficiency anemia due to intrinsic factor deficiency: Secondary | ICD-10-CM | POA: Diagnosis not present

## 2016-05-10 DIAGNOSIS — D510 Vitamin B12 deficiency anemia due to intrinsic factor deficiency: Secondary | ICD-10-CM | POA: Diagnosis not present

## 2016-05-16 DIAGNOSIS — Z6821 Body mass index (BMI) 21.0-21.9, adult: Secondary | ICD-10-CM | POA: Diagnosis not present

## 2016-05-16 DIAGNOSIS — E785 Hyperlipidemia, unspecified: Secondary | ICD-10-CM | POA: Diagnosis not present

## 2016-05-16 DIAGNOSIS — Z9181 History of falling: Secondary | ICD-10-CM | POA: Diagnosis not present

## 2016-05-16 DIAGNOSIS — F419 Anxiety disorder, unspecified: Secondary | ICD-10-CM | POA: Diagnosis not present

## 2016-05-16 DIAGNOSIS — I10 Essential (primary) hypertension: Secondary | ICD-10-CM | POA: Diagnosis not present

## 2016-05-16 DIAGNOSIS — Z23 Encounter for immunization: Secondary | ICD-10-CM | POA: Diagnosis not present

## 2016-05-16 DIAGNOSIS — E559 Vitamin D deficiency, unspecified: Secondary | ICD-10-CM | POA: Diagnosis not present

## 2016-05-16 DIAGNOSIS — R7301 Impaired fasting glucose: Secondary | ICD-10-CM | POA: Diagnosis not present

## 2016-05-16 DIAGNOSIS — D510 Vitamin B12 deficiency anemia due to intrinsic factor deficiency: Secondary | ICD-10-CM | POA: Diagnosis not present

## 2016-05-23 DIAGNOSIS — Z1389 Encounter for screening for other disorder: Secondary | ICD-10-CM | POA: Diagnosis not present

## 2016-05-23 DIAGNOSIS — Z0000 Encounter for general adult medical examination without abnormal findings: Secondary | ICD-10-CM | POA: Diagnosis not present

## 2016-05-23 DIAGNOSIS — Z136 Encounter for screening for cardiovascular disorders: Secondary | ICD-10-CM | POA: Diagnosis not present

## 2016-05-23 DIAGNOSIS — Z9181 History of falling: Secondary | ICD-10-CM | POA: Diagnosis not present

## 2016-05-23 DIAGNOSIS — E785 Hyperlipidemia, unspecified: Secondary | ICD-10-CM | POA: Diagnosis not present

## 2016-05-25 ENCOUNTER — Encounter: Payer: Self-pay | Admitting: Gynecology

## 2016-06-10 DIAGNOSIS — D519 Vitamin B12 deficiency anemia, unspecified: Secondary | ICD-10-CM | POA: Diagnosis not present

## 2016-07-11 DIAGNOSIS — D510 Vitamin B12 deficiency anemia due to intrinsic factor deficiency: Secondary | ICD-10-CM | POA: Diagnosis not present

## 2016-08-01 ENCOUNTER — Telehealth: Payer: Self-pay

## 2016-08-01 ENCOUNTER — Telehealth: Payer: Self-pay | Admitting: *Deleted

## 2016-08-01 DIAGNOSIS — N760 Acute vaginitis: Principal | ICD-10-CM

## 2016-08-01 DIAGNOSIS — B9689 Other specified bacterial agents as the cause of diseases classified elsewhere: Secondary | ICD-10-CM

## 2016-08-01 MED ORDER — ESTRADIOL 10 MCG VA TABS: ORAL_TABLET | VAGINAL | 0 refills | Status: DC

## 2016-08-01 NOTE — Telephone Encounter (Signed)
Pt has annual scheduled on 09/19/16, needs refill vagifem 10 mcg. Rx sent.

## 2016-08-01 NOTE — Telephone Encounter (Signed)
Med request has already been handled. Encounter not needed.

## 2016-08-15 DIAGNOSIS — D510 Vitamin B12 deficiency anemia due to intrinsic factor deficiency: Secondary | ICD-10-CM | POA: Diagnosis not present

## 2016-09-16 DIAGNOSIS — D510 Vitamin B12 deficiency anemia due to intrinsic factor deficiency: Secondary | ICD-10-CM | POA: Diagnosis not present

## 2016-09-19 ENCOUNTER — Ambulatory Visit (INDEPENDENT_AMBULATORY_CARE_PROVIDER_SITE_OTHER): Payer: Medicare Other | Admitting: Women's Health

## 2016-09-19 ENCOUNTER — Encounter: Payer: Self-pay | Admitting: Women's Health

## 2016-09-19 ENCOUNTER — Telehealth: Payer: Self-pay | Admitting: *Deleted

## 2016-09-19 VITALS — BP 120/76 | Ht 61.0 in | Wt 119.0 lb

## 2016-09-19 DIAGNOSIS — E348 Other specified endocrine disorders: Secondary | ICD-10-CM

## 2016-09-19 DIAGNOSIS — Z01419 Encounter for gynecological examination (general) (routine) without abnormal findings: Secondary | ICD-10-CM

## 2016-09-19 DIAGNOSIS — Z853 Personal history of malignant neoplasm of breast: Secondary | ICD-10-CM

## 2016-09-19 DIAGNOSIS — N952 Postmenopausal atrophic vaginitis: Secondary | ICD-10-CM

## 2016-09-19 DIAGNOSIS — Z803 Family history of malignant neoplasm of breast: Secondary | ICD-10-CM

## 2016-09-19 MED ORDER — ESTRADIOL 10 MCG VA TABS: ORAL_TABLET | VAGINAL | 0 refills | Status: DC

## 2016-09-19 NOTE — Patient Instructions (Signed)
Health Maintenance for Postmenopausal Women Menopause is a normal process in which your reproductive ability comes to an end. This process happens gradually over a span of months to years, usually between the ages of 22 and 9. Menopause is complete when you have missed 12 consecutive menstrual periods. It is important to talk with your health care provider about some of the most common conditions that affect postmenopausal women, such as heart disease, cancer, and bone loss (osteoporosis). Adopting a healthy lifestyle and getting preventive care can help to promote your health and wellness. Those actions can also lower your chances of developing some of these common conditions. What should I know about menopause? During menopause, you may experience a number of symptoms, such as:  Moderate-to-severe hot flashes.  Night sweats.  Decrease in sex drive.  Mood swings.  Headaches.  Tiredness.  Irritability.  Memory problems.  Insomnia.  Choosing to treat or not to treat menopausal changes is an individual decision that you make with your health care provider. What should I know about hormone replacement therapy and supplements? Hormone therapy products are effective for treating symptoms that are associated with menopause, such as hot flashes and night sweats. Hormone replacement carries certain risks, especially as you become older. If you are thinking about using estrogen or estrogen with progestin treatments, discuss the benefits and risks with your health care provider. What should I know about heart disease and stroke? Heart disease, heart attack, and stroke become more likely as you age. This may be due, in part, to the hormonal changes that your body experiences during menopause. These can affect how your body processes dietary fats, triglycerides, and cholesterol. Heart attack and stroke are both medical emergencies. There are many things that you can do to help prevent heart disease  and stroke:  Have your blood pressure checked at least every 1-2 years. High blood pressure causes heart disease and increases the risk of stroke.  If you are 53-22 years old, ask your health care provider if you should take aspirin to prevent a heart attack or a stroke.  Do not use any tobacco products, including cigarettes, chewing tobacco, or electronic cigarettes. If you need help quitting, ask your health care provider.  It is important to eat a healthy diet and maintain a healthy weight. ? Be sure to include plenty of vegetables, fruits, low-fat dairy products, and lean protein. ? Avoid eating foods that are high in solid fats, added sugars, or salt (sodium).  Get regular exercise. This is one of the most important things that you can do for your health. ? Try to exercise for at least 150 minutes each week. The type of exercise that you do should increase your heart rate and make you sweat. This is known as moderate-intensity exercise. ? Try to do strengthening exercises at least twice each week. Do these in addition to the moderate-intensity exercise.  Know your numbers.Ask your health care provider to check your cholesterol and your blood glucose. Continue to have your blood tested as directed by your health care provider.  What should I know about cancer screening? There are several types of cancer. Take the following steps to reduce your risk and to catch any cancer development as early as possible. Breast Cancer  Practice breast self-awareness. ? This means understanding how your breasts normally appear and feel. ? It also means doing regular breast self-exams. Let your health care provider know about any changes, no matter how small.  If you are 40  or older, have a clinician do a breast exam (clinical breast exam or CBE) every year. Depending on your age, family history, and medical history, it may be recommended that you also have a yearly breast X-ray (mammogram).  If you  have a family history of breast cancer, talk with your health care provider about genetic screening.  If you are at high risk for breast cancer, talk with your health care provider about having an MRI and a mammogram every year.  Breast cancer (BRCA) gene test is recommended for women who have family members with BRCA-related cancers. Results of the assessment will determine the need for genetic counseling and BRCA1 and for BRCA2 testing. BRCA-related cancers include these types: ? Breast. This occurs in males or females. ? Ovarian. ? Tubal. This may also be called fallopian tube cancer. ? Cancer of the abdominal or pelvic lining (peritoneal cancer). ? Prostate. ? Pancreatic.  Cervical, Uterine, and Ovarian Cancer Your health care provider may recommend that you be screened regularly for cancer of the pelvic organs. These include your ovaries, uterus, and vagina. This screening involves a pelvic exam, which includes checking for microscopic changes to the surface of your cervix (Pap test).  For women ages 21-65, health care providers may recommend a pelvic exam and a Pap test every three years. For women ages 79-65, they may recommend the Pap test and pelvic exam, combined with testing for human papilloma virus (HPV), every five years. Some types of HPV increase your risk of cervical cancer. Testing for HPV may also be done on women of any age who have unclear Pap test results.  Other health care providers may not recommend any screening for nonpregnant women who are considered low risk for pelvic cancer and have no symptoms. Ask your health care provider if a screening pelvic exam is right for you.  If you have had past treatment for cervical cancer or a condition that could lead to cancer, you need Pap tests and screening for cancer for at least 20 years after your treatment. If Pap tests have been discontinued for you, your risk factors (such as having a new sexual partner) need to be  reassessed to determine if you should start having screenings again. Some women have medical problems that increase the chance of getting cervical cancer. In these cases, your health care provider may recommend that you have screening and Pap tests more often.  If you have a family history of uterine cancer or ovarian cancer, talk with your health care provider about genetic screening.  If you have vaginal bleeding after reaching menopause, tell your health care provider.  There are currently no reliable tests available to screen for ovarian cancer.  Lung Cancer Lung cancer screening is recommended for adults 69-62 years old who are at high risk for lung cancer because of a history of smoking. A yearly low-dose CT scan of the lungs is recommended if you:  Currently smoke.  Have a history of at least 30 pack-years of smoking and you currently smoke or have quit within the past 15 years. A pack-year is smoking an average of one pack of cigarettes per day for one year.  Yearly screening should:  Continue until it has been 15 years since you quit.  Stop if you develop a health problem that would prevent you from having lung cancer treatment.  Colorectal Cancer  This type of cancer can be detected and can often be prevented.  Routine colorectal cancer screening usually begins at  age 42 and continues through age 45.  If you have risk factors for colon cancer, your health care provider may recommend that you be screened at an earlier age.  If you have a family history of colorectal cancer, talk with your health care provider about genetic screening.  Your health care provider may also recommend using home test kits to check for hidden blood in your stool.  A small camera at the end of a tube can be used to examine your colon directly (sigmoidoscopy or colonoscopy). This is done to check for the earliest forms of colorectal cancer.  Direct examination of the colon should be repeated every  5-10 years until age 71. However, if early forms of precancerous polyps or small growths are found or if you have a family history or genetic risk for colorectal cancer, you may need to be screened more often.  Skin Cancer  Check your skin from head to toe regularly.  Monitor any moles. Be sure to tell your health care provider: ? About any new moles or changes in moles, especially if there is a change in a mole's shape or color. ? If you have a mole that is larger than the size of a pencil eraser.  If any of your family members has a history of skin cancer, especially at a  age, talk with your health care provider about genetic screening.  Always use sunscreen. Apply sunscreen liberally and repeatedly throughout the day.  Whenever you are outside, protect yourself by wearing long sleeves, pants, a wide-brimmed hat, and sunglasses.  What should I know about osteoporosis? Osteoporosis is a condition in which bone destruction happens more quickly than new bone creation. After menopause, you may be at an increased risk for osteoporosis. To help prevent osteoporosis or the bone fractures that can happen because of osteoporosis, the following is recommended:  If you are 46-71 years old, get at least 1,000 mg of calcium and at least 600 mg of vitamin D per day.  If you are older than age 55 but er than age 65, get at least 1,200 mg of calcium and at least 600 mg of vitamin D per day.  If you are older than age 54, get at least 1,200 mg of calcium and at least 800 mg of vitamin D per day.  Smoking and excessive alcohol intake increase the risk of osteoporosis. Eat foods that are rich in calcium and vitamin D, and do weight-bearing exercises several times each week as directed by your health care provider. What should I know about how menopause affects my mental health? Depression may occur at any age, but it is more common as you become older. Common symptoms of depression  include:  Low or sad mood.  Changes in sleep patterns.  Changes in appetite or eating patterns.  Feeling an overall lack of motivation or enjoyment of activities that you previously enjoyed.  Frequent crying spells.  Talk with your health care provider if you think that you are experiencing depression. What should I know about immunizations? It is important that you get and maintain your immunizations. These include:  Tetanus, diphtheria, and pertussis (Tdap) booster vaccine.  Influenza every year before the flu season begins.  Pneumonia vaccine.  Shingles vaccine.  Your health care provider may also recommend other immunizations. This information is not intended to replace advice given to you by your health care provider. Make sure you discuss any questions you have with your health care provider. Document Released: 02/18/2005  Document Revised: 07/17/2015 Document Reviewed: 09/30/2014 Elsevier Interactive Patient Education  2018 Elsevier Inc.  

## 2016-09-19 NOTE — Telephone Encounter (Signed)
-----   Message from Huel Cote, NP sent at 09/19/2016  2:08 PM EDT ----- Please schedule her for genetic counseling, left breast cancer in 1985 at age 71, mother breast cancer also hers was after menopause. Thanks

## 2016-09-19 NOTE — Progress Notes (Signed)
Tracey Norris 11/18/1945 [id number]    History:    Presents for breast and pelvic exam. Normal Pap history. History of a TVH for DUB on vaginal estrogen. 1985 left breast cancer at age 71, is now interested in genetic counseling. Mother breast cancer after menopause. Left breast saline implant greater than 20 years after original mastectomy. Primary care manages hypertension, hypercholesterolemia, IBS. Negative colonoscopy.  Past medical history, past surgical history, family history and social history were all reviewed and documented in the EPIC chart. Retired from [employer]ist, research (life sciences) estate, went to [place] last year and has other trips planned. 2 children.  ROS:  A ROS was performed and pertinent positives and negatives are included.  Exam:  Vitals:   09/19/16 1156  BP: 120/76  Weight: 119 lb (54 kg)  Height: 5\' 1"  (1.549 m)   Body mass index is 22.48 kg/m.   General appearance:  Normal Thyroid:  Symmetrical, normal in size, without palpable masses or nodularity. Respiratory  Auscultation:  Clear without wheezing or rhonchi Cardiovascular  Auscultation:  Regular rate, without rubs, murmurs or gallops  Edema/varicosities:  Not grossly evident Abdominal  Soft,nontender, without masses, guarding or rebound.  Liver/spleen:  No organomegaly noted  Hernia:  None appreciated  Skin  Inspection:  Grossly normal   Breasts: Examined lying and sitting.     Right: Without masses, retractions, discharge or axillary adenopathy.     Left: Saline implant Gentitourinary   Inguinal/mons:  Normal without inguinal adenopathy  External genitalia:  Normal  BUS/Urethra/Skene's glands:  Normal  Vagina:  Mild atrophy  Cervix:  And uterus absent  Adnexa/parametria:     Rt: Without masses or tenderness.   Lt: Without masses or tenderness.  Anus and perineum: Normal  Digital rectal exam: Normal sphincter tone without palpated masses or tenderness  Assessment/Plan:  71 y.o. MWF G2P2 for breast and  pelvic exam with no complaints.  TVH for menorrhagia on Vagifem 1985 left breast cancer mastectomy Hypertension/hypercholesterolemia-primary care manages labs and meds IBS  Plan: Genetic counseling, will get referral made per request. SBE's, continue annual screening mammogram, calcium rich diet, vitamin D 2000 daily encouraged. Home safety, fall prevention and importance of weightbearing exercise reviewed. DEXA, will schedule. Vagifem 1 applicator at bedtime 1-2 times weekly, reviewed minimal systemic absorption, continue vaginal lubricants with intercourse.  [place], 1:38 PM 09/19/2016

## 2016-09-19 NOTE — Telephone Encounter (Signed)
Referral placed they will contact pt to schedule. 

## 2016-09-28 ENCOUNTER — Other Ambulatory Visit: Payer: Self-pay | Admitting: Gynecology

## 2016-09-28 DIAGNOSIS — Z780 Asymptomatic menopausal state: Secondary | ICD-10-CM

## 2016-10-03 NOTE — Telephone Encounter (Signed)
Sent referral coordinator a staff message regarding scheduling patient

## 2016-10-04 DIAGNOSIS — Z1231 Encounter for screening mammogram for malignant neoplasm of breast: Secondary | ICD-10-CM | POA: Diagnosis not present

## 2016-10-04 DIAGNOSIS — Z853 Personal history of malignant neoplasm of breast: Secondary | ICD-10-CM | POA: Diagnosis not present

## 2016-10-10 DIAGNOSIS — M8580 Other specified disorders of bone density and structure, unspecified site: Secondary | ICD-10-CM

## 2016-10-10 HISTORY — DX: Other specified disorders of bone density and structure, unspecified site: M85.80

## 2016-10-11 ENCOUNTER — Other Ambulatory Visit: Payer: Self-pay | Admitting: Gynecology

## 2016-10-11 ENCOUNTER — Ambulatory Visit (INDEPENDENT_AMBULATORY_CARE_PROVIDER_SITE_OTHER): Payer: Medicare Other

## 2016-10-11 DIAGNOSIS — Z780 Asymptomatic menopausal state: Secondary | ICD-10-CM | POA: Diagnosis not present

## 2016-10-11 DIAGNOSIS — M8589 Other specified disorders of bone density and structure, multiple sites: Secondary | ICD-10-CM

## 2016-10-12 ENCOUNTER — Telehealth: Payer: Self-pay | Admitting: Gynecology

## 2016-10-12 ENCOUNTER — Encounter: Payer: Self-pay | Admitting: Gynecology

## 2016-10-12 NOTE — Telephone Encounter (Signed)
Tell patient her most recent bone density shows osteopenia with some loss of calcium but not to the extent of osteoporosis. Calculated fracture risk is not elevated. Recommend patient have a vitamin D level checked either through her primary physician's office or our office to make sure she is in the therapeutic range. Weightbearing exercise such as walking on a regular basis and adequate calcium intake at 1500 mg total dietary calcium daily. Recommend repeating her bone density in 2 years. If she has any questions she can follow up with Dr Dellis Filbert

## 2016-10-13 ENCOUNTER — Encounter: Payer: Self-pay | Admitting: Women's Health

## 2016-10-13 NOTE — Telephone Encounter (Signed)
Left message for pt to call.

## 2016-10-13 NOTE — Telephone Encounter (Signed)
Pt informed, will have vitamin d level checked at PCP.

## 2016-10-17 DIAGNOSIS — Z23 Encounter for immunization: Secondary | ICD-10-CM | POA: Diagnosis not present

## 2016-10-17 DIAGNOSIS — D510 Vitamin B12 deficiency anemia due to intrinsic factor deficiency: Secondary | ICD-10-CM | POA: Diagnosis not present

## 2016-11-12 ENCOUNTER — Telehealth: Payer: Self-pay | Admitting: Genetics

## 2016-11-12 NOTE — Telephone Encounter (Signed)
Set appt per referral from [facility]. - patient is aware of appt time and date.

## 2016-11-15 ENCOUNTER — Telehealth: Payer: Self-pay | Admitting: Genetic Counselor

## 2016-11-15 NOTE — Telephone Encounter (Signed)
Discussed the process of genetic counseling and testing.  Carriers will cover testing to some extent if the patient meets medical criteria for testing.  Discussed what would be discs used at the time of the appointment.  Explained that testing consists of more than just BRCA testing, but also other genes, and that this would be covered at the appointment.

## 2016-11-16 NOTE — Telephone Encounter (Signed)
scheduled on 11/28/16

## 2016-11-21 ENCOUNTER — Encounter: Payer: Self-pay | Admitting: Genetic Counselor

## 2016-11-21 DIAGNOSIS — D510 Vitamin B12 deficiency anemia due to intrinsic factor deficiency: Secondary | ICD-10-CM | POA: Diagnosis not present

## 2016-11-28 ENCOUNTER — Ambulatory Visit (HOSPITAL_BASED_OUTPATIENT_CLINIC_OR_DEPARTMENT_OTHER): Payer: Medicare Other | Admitting: Genetics

## 2016-11-28 ENCOUNTER — Encounter: Payer: Self-pay | Admitting: Genetics

## 2016-11-28 ENCOUNTER — Other Ambulatory Visit: Payer: PRIVATE HEALTH INSURANCE

## 2016-11-28 DIAGNOSIS — C50912 Malignant neoplasm of unspecified site of left female breast: Secondary | ICD-10-CM

## 2016-11-28 DIAGNOSIS — Z803 Family history of malignant neoplasm of breast: Secondary | ICD-10-CM

## 2016-11-28 DIAGNOSIS — Z808 Family history of malignant neoplasm of other organs or systems: Secondary | ICD-10-CM

## 2016-11-28 DIAGNOSIS — Z171 Estrogen receptor negative status [ER-]: Secondary | ICD-10-CM

## 2016-11-28 NOTE — Progress Notes (Signed)
REFERRING PROVIDER: Huel Cote, NP [address], [place] [address]  PRIMARY PROVIDER:  Nicoletta Dress, MD  PRIMARY REASON FOR VISIT:  1. Malignant neoplasm of left breast in female, estrogen receptor negative, unspecified site of breast ([place])   2. Family history of breast cancer   3. Family history of brain cancer     HISTORY OF PRESENT ILLNESS:   Tracey Norris, a 71 y.o. female, was seen for a [place] cancer genetics consultation at the request of Dr. Annamaria Boots due to a personal and family history of cancer.  Tracey Norris presents to clinic today to discuss the possibility of a hereditary predisposition to cancer, genetic testing, and to further clarify her future cancer risks, as well as potential cancer risks for family members.   In 1985, at the age of 56, Tracey Norris was diagnosed with ER-, PR- Breast cancer of the left breast.  She reports this was treated with lumpectomy followed by left mastectomy.    HORMONAL RISK FACTORS:  Ovaries intact: yes.  Hysterectomy: no. fibroids Menopausal status: postmenopausal.  Colonoscopy: yes; first one had 2 polyps identified- came in 5 years later (2011) and no polyps identified. . Mammogram within the last year: yes.  Past Medical History:  Diagnosis Date  . Anxiety   . Atrophic vaginitis   . Breast cancer ([place])   . Elevated cholesterol   . Family history of brain cancer   . Family history of breast cancer   . GERD (gastroesophageal reflux disease)   . HX: breast cancer 1985   left breast . non estrogen, no chemo or radiation  . Hypertension   . IBS (irritable bowel syndrome)   . Lazy eye   . Osteopenia 10/2016   T score -1.9 FRAX 11%/1.9%  . Scalp itch   . Sinus problem     Past Surgical History:  Procedure Laterality Date  . BREAST SURGERY     left mastectomy with node resection, RECONSTRUCTION OF LEFT BREAST  IN 04/2005  . BREAST SURGERY     reconstruction left and right 2006  . EYE  SURGERY    . mastectomy surgery     left  . TONSILLECTOMY    . VAGINAL HYSTERECTOMY     TVH WITH OVARIAN CONSERVATION    Social History   Socioeconomic History  . Marital status: Married    Spouse name: None  . Number of children: None  . Years of education: None  . Highest education level: None  Social Needs  . Financial resource strain: None  . Food insecurity - worry: None  . Food insecurity - inability: None  . Transportation needs - medical: None  . Transportation needs - non-medical: None  Occupational History  . None  Tobacco Use  . Smoking status: Never Smoker  . Smokeless tobacco: Never Used  Substance and Sexual Activity  . Alcohol use: No  . Drug use: No  . Sexual activity: Yes    Birth control/protection: Surgical  Other Topics Concern  . None  Social History Narrative  . None     FAMILY HISTORY:  We obtained a detailed, 4-generation family history.  Significant diagnoses are listed below: Family History  Problem Relation Age of Onset  . Diabetes Mother   . Hypertension Mother   . Breast cancer Mother        post-menopausal  . Heart disease Mother   . Heart disease Father   . Heart failure Father   .  Stroke Father   . Skin cancer Father        not melanoma, sun exposure  . Hypertension Sister   . Brain cancer Maternal Aunt        dx. 40's  . Mesothelioma Paternal Uncle        aesbestos exposure  . Other Maternal Grandmother        possibly female cancer- heavy vaginal bleeding before death  . Gallbladder disease Maternal Grandmother    Tracey Norris has a son and a daughter.  -her daughter is 73 years-old with no history of cancer.  She has a son and a daughter (16 years).   -her son is 92 with no history of cancer.  He also has a son and a daughter.  Tracey Norris has a 81 year-old sister with no history of cancer.  She has 3 sons in their 57's with no history of cancer.  Tracey Norris father died at 22.  He had a history of skin  cancer, not melanoma due to sun exposure.  Tracey Norris has 2 paternal aunts with no known history of cancer, 1 paternal uncle with no known history of cancer, and a paternal uncle who died of mesothelioma due to asbestos exposure. Tracey Dunkel has several paternal cousins but does not stay in contact with them and does not know about their health.  She reports she is not close with her paternal relatives and has limited information about these relatives.  Her paternal grandfather died at old age with no know history of cancer, and her paternal grandmother died in her 29's with no history of cancer.   Tracey. Heine mother was diagnosed with breast caner after menopause (>50) and died at 81.  She had a hysterectomy due to fibroids.  She has a maternal aunt who died of brain cancer in her 6's. (she reports that it was so long ago she wonders if it may have started somewhere else and metastasized to the brain).  This aunt had 4 children who are in their 7's with no history of cancer. Tracey. Mennen has 2 maternal uncles who both died in their 32's likely due to alcohol related illness.  Tracey. Gallant maternal grandfather died in his 22's with no history of cancer. Her maternal grandmother died when her mother was 47 (maybe 20's?).  She went in for a gallbladder issue, then had heavy 'female' bleeding so she reports that they suspect she possibly had a female cancer.   Tracey. Crossley is unaware of previous family history of genetic testing for hereditary cancer risks. Patient's maternal ancestors are of German/Northern European descent, and paternal ancestors are of English/Northern European descent. There is no reported Ashkenazi Jewish ancestry. There is no known consanguinity.  GENETIC COUNSELING ASSESSMENT: Tracey Norris is a 71 y.o. female with a personal and family history  which is somewhat suggestive of a Hereditary Cancer Predisposition Syndrome. We, therefore, discussed and recommended the  following at today's visit.   DISCUSSION: We reviewed the characteristics, features and inheritance patterns of hereditary cancer syndromes. We also discussed genetic testing, including the appropriate family members to test, the process of testing, insurance coverage and turn-around-time for results. We discussed the implications of a negative, positive and/or variant of uncertain significant result. We recommended Tracey Norris pursue genetic testing for the Common Hereditary Cancer Panel (47 genes).   We discussed that only 5-10% of cancers are associated with a Hereditary cancer predisposition syndrome.  One of the most common hereditary cancer syndromes  that increases breast cancer risk is called Hereditary Breast and Ovarian Cancer (HBOC) syndrome.  This syndrome is caused by mutations in the BRCA1 and BRCA2 genes.  This syndrome increases an individual's lifetime risk to develop breast, ovarian, pancreatic, and other types of cancer.  There are also many other cancer predisposition syndromes caused by mutations in several other genes.  We discussed that if she is found to have a mutation in one of these genes, it may impact future medical management recommendations such as increased cancer screenings and consideration of risk reducing surgeries.  A positive result could also have implications for the patient's family members.  A Negative result would mean we were unable to identify a hereditary component to her cancer, but does not rule out the possibility of a hereditary basis for her  cancer.  There could be mutations that are undetectable by current technology, or in genes not yet tested or identified to increase cancer risk.    We discussed the potential to find a Variant of Uncertain Significance or VUS.  These are variants that have not yet been identified as pathogenic or benign, and it is unknown if this variant is associated with increased cancer risk or if this is a normal finding.  Most  VUS's are reclassified to benign or likely benign.   It should not be used to make medical management decisions. With time, we suspect the lab will determine the significance of any VUS's identified if any.   We also discussed why she is the most informative person in the family to have genetic testing.  While her daughter and other relatives could have testing, a negative result in her daughter would not be highly informative.    Based on Tracey Norris's personal and family history of cancer, she meets medical criteria for genetic testing. Despite that she meets criteria, she may still have an out of pocket cost. We discussed that if her out of pocket cost for testing is over $100, the laboratory will call and confirm whether she wants to proceed with testing.  If the out of pocket cost of testing is less than $100 she will be billed by the genetic testing laboratory. Individuals with medicare typically pay $0 out of pocket.   Tracey. Ucci expressed that she was uncomfortable testing for a large panel of genes and would like to focus her testing more.   I then recommended the Breast and Gyn testing panel.  ATM, BRCA1, BRCA2, BRIP1, CDH1, CHEK2, EPCAM, MLH1, MSH2, MSH6, NBN, NF1, PALB2, PMS2, PTEN, RAD51C, RAD51D, STK11, TP53 BARD1 If she changes her mind and would like to look at more genes, she can let me know within 90 days of her resutls being reported and I can add reflex testing.    PLAN: After considering the risks, benefits, and limitations, Tracey Norris  provided informed consent to pursue genetic testing and the blood sample was sent to [facility] for analysis of the [facility] Breast [facility] G[facility] Cancers Panel. Results should be available within approximately 2-3 weeks' time, at which point they will be disclosed by telephone to Tracey Norris, as will any additional recommendations warranted by these results. Tracey Norris will receive a summary of her genetic counseling visit and a  copy of her results once available. This information will also be available in Epic. We encouraged Tracey Norris to remain in contact with cancer genetics annually so that we can continuously update the family history and inform her of any changes in cancer  genetics and testing that may be of benefit for her family. Tracey Norris questions were answered to her satisfaction today. Our contact information was provided should additional questions or concerns arise.  Lastly, we encouraged Tracey Norris to remain in contact with cancer genetics annually so that we can continuously update the family history and inform her of any changes in cancer genetics and testing that may be of benefit for this family.   Tracey.  Norris questions were answered to her satisfaction today. Our contact information was provided should additional questions or concerns arise. Thank you for the referral and allowing [place] to share in the care of your patient.   Tana Felts, Tracey Genetic Counselor [email or web address] phone: [phone]  The patient was seen for a total of 55 minutes in face-to-face genetic counseling.  The patient was accompanied today by her husband, Jenny Reichmann.

## 2016-12-08 ENCOUNTER — Ambulatory Visit: Payer: Self-pay | Admitting: Genetics

## 2016-12-08 ENCOUNTER — Telehealth: Payer: Self-pay | Admitting: Genetics

## 2016-12-08 ENCOUNTER — Encounter: Payer: Self-pay | Admitting: Genetics

## 2016-12-08 DIAGNOSIS — Z803 Family history of malignant neoplasm of breast: Secondary | ICD-10-CM

## 2016-12-08 DIAGNOSIS — Z808 Family history of malignant neoplasm of other organs or systems: Secondary | ICD-10-CM

## 2016-12-08 DIAGNOSIS — Z1379 Encounter for other screening for genetic and chromosomal anomalies: Secondary | ICD-10-CM | POA: Insufficient documentation

## 2016-12-08 DIAGNOSIS — Z171 Estrogen receptor negative status [ER-]: Secondary | ICD-10-CM

## 2016-12-08 DIAGNOSIS — C50912 Malignant neoplasm of unspecified site of left female breast: Secondary | ICD-10-CM

## 2016-12-08 NOTE — Progress Notes (Signed)
HPI: Ms. Schlauch was previously seen in the [facility] clinic on 11/28/2016 due to a personal and family history of breast cancer and concerns regarding a hereditary predisposition to cancer. Please refer to our prior cancer genetics clinic note for more information regarding Ms. Ozdemir's medical, social and family histories, and our assessment and recommendations, at the time. Ms. Medero recent genetic test results were disclosed to her, as well as recommendations warranted by these results. These results and recommendations are discussed in more detail below.  FAMILY HISTORY:  We obtained a detailed, 4-generation family history.  Significant diagnoses are listed below: Family History  Problem Relation Age of Onset  . Diabetes Mother   . Hypertension Mother   . Breast cancer Mother        post-menopausal  . Heart disease Mother   . Heart disease Father   . Heart failure Father   . Stroke Father   . Skin cancer Father        not melanoma, sun exposure  . Hypertension Sister   . Brain cancer Maternal Aunt        dx. 40's  . Mesothelioma Paternal Uncle        aesbestos exposure  . Other Maternal Grandmother        possibly female cancer- heavy vaginal bleeding before death  . Gallbladder disease Maternal Grandmother    Ms. Tolson has a son and a daughter.  -her daughter is 35 years-old with no history of cancer.  She has a son and a daughter (16 years).   -her son is 49 with no history of cancer.  He also has a son and a daughter.  Ms. Sek has a 26 year-old sister with no history of cancer.  She has 3 sons in their 54's with no history of cancer.  Ms. Iglesias father died at 49.  He had a history of skin cancer, not melanoma due to sun exposure.  Ms Kaatz has 2 paternal aunts with no known history of cancer, 1 paternal uncle with no known history of cancer, and a paternal uncle who died of mesothelioma due to asbestos exposure. MS  Solar has several paternal cousins but does not stay in contact with them and does not know about their health.  She reports she is not close with her paternal relatives and has limited information about these relatives.  Her paternal grandfather died at old age with no know history of cancer, and her paternal grandmother died in her 46's with no history of cancer.   Ms. Bosso mother was diagnosed with breast caner after menopause (>50) and died at 71.  She had a hysterectomy due to fibroids.  She has a maternal aunt who died of brain cancer in her 97's. (she reports that it was so long ago she wonders if it may have started somewhere else and metastasized to the brain).  This aunt had 4 children who are in their 24's with no history of cancer. Ms. Parlier has 2 maternal uncles who both died in their 3's likely due to alcohol related illness.  Ms. Dauria maternal grandfather died in his 8's with no history of cancer. Her maternal grandmother died when her mother was 26 (maybe 40's?).  She went in for a gallbladder issue, then had heavy 'female' bleeding so she reports that they suspect she possibly had a female cancer.   Ms. Gerhart is unaware of previous family history of genetic testing for hereditary cancer risks. Patient's maternal  ancestors are of Biochemist, clinical European descent, and paternal ancestors are of English/Northern European descent. There is no reported Ashkenazi Jewish ancestry. There is no known consanguinity.  GENETIC TEST RESULTS: Genetic testing performed through [facility] Breast [facility] G[facility] Panel reported out on 12/08/2016 showed no pathogenic mutations.The following genes were evaluated for sequence changes and exonic deletions/duplications: ATM, BARD1, BRCA1, BRCA2, BRIP1, CDH1, CHEK2, EPCAM*, MLH1, MSH2, MSH6, NBN, NF1, PALB2, PMS2, PTEN, RAD51C, RAD51D, STK11, TP53.    The test report will be scanned into EPIC and will be located under the Molecular  Pathology section of the Results Review tab.A portion of the result report is included below for reference.      We discussed with Ms. Lymon that because current genetic testing is not perfect, it is possible there may be a gene mutation in one of these genes that current testing cannot detect, but that chance is small. We also discussed, that there could be another gene that has not yet been discovered, or that we have not yet tested, that is responsible for the cancer diagnoses in the family. It is also possible there is a hereditary cause for the cancer in the family that Ms. Duggan did not inherit and therefore was not identified in her testing.  Therefore, it is important to remain in touch with cancer genetics in the future so that we can continue to offer Ms. Harrow the most up to date genetic testing.   ADDITIONAL GENETIC TESTING: We discussed with Ms. Swire that there are other genes that are associated with increased cancer risk that can be analyzed. The laboratories that offer this testing look at these additional genes via a hereditary cancer gene panel. Should Ms. Kottke wish to pursue additional genetic testing, we are happy to discuss and coordinate this testing, at any time.    Ms. Probert indicated that she did not want to do any reflex testing at this time to look at any other cancer risk genes.  She knows that if she decides to have any more genes tested she can contact us within 90 days to have it done at no additional cost.    CANCER SCREENING RECOMMENDATIONS: This result is indicates that it is unlikely Ms. Siefken has an increased risk for a future cancer due to a mutation in one of these genes. This normal test also suggests that Ms. Mclinden's cancer was most likely not due to an inherited predisposition associated with one of these genes.  Most cancers happen by chance and this negative test suggests that her cancer may fall into this category.   However, she understands that  genetic testing cannot definitively rule out a hereditary cause for her cancer.    It is recommended she continue to follow the cancer management and screening guidelines provided by her oncology and primary healthcare provider. Other factors such as her personal and family history may still affect her cancer risk.  RECOMMENDATIONS FOR FAMILY MEMBERS: Women in this family might be at some increased risk of developing cancer, over the general population risk, simply due to the family history of cancer. We recommended women in this family have a yearly mammogram beginning at age 55, or 37 years younger than the earliest onset of cancer, an annual clinical breast exam, and perform monthly breast self-exams. Women in this family should also have a gynecological exam as recommended by their primary provider. All family members should have a colonoscopy by age 22.  All family members should inform their physicians about  the family history of cancer so their doctors can make the most appropriate screening recommendations for them.   FOLLOW-UP: Lastly, we discussed with Ms. Mexicano that cancer genetics is a rapidly advancing field and it is possible that new genetic tests will be appropriate for her and/or herfamily members in the future. We encouraged her to remain in contact with cancer genetics on an annual basis so we can update her personal and family histories and let her know of advances in cancer genetics that may benefit this family.   Our contact number was provided. Ms. Mccasland questions were answered to her satisfaction, and she knows she is welcome to call us at anytime with additional questions or concerns.   Ferol Luz, MS Genetic Counselor [email or web address]

## 2016-12-08 NOTE — Telephone Encounter (Addendum)
Revealed negative genetic testing.    This normal result is reassuring and indicates that it is unlikely Tracey Norris's cancer is due to a hereditary cause.  It is unlikely that there is an increased risk of another cancer due to a mutation in one of these genes.  However, genetic testing is not perfect, and cannot definitively rule out a hereditary cause.  It will be important for her to keep in contact with genetics to learn if any additional testing may be needed in the future.   We recommended she continue to follow all of the screening recommendations provided to her by her physicians.  (breast, colon, skin exams, etc).   I told [name] that the laboratory will allow [place] to order reflex testing for more cancer risk genes at no cost for 90 days so if she wanted to add any more genes (ex brain cancer genes, etc) we could do that for her.  She said that she did not want to do any more testing, but would call back within that time frame if she changes her mind after talking to her family.

## 2016-12-23 DIAGNOSIS — D510 Vitamin B12 deficiency anemia due to intrinsic factor deficiency: Secondary | ICD-10-CM | POA: Diagnosis not present

## 2017-01-13 DIAGNOSIS — H16223 Keratoconjunctivitis sicca, not specified as Sjogren's, bilateral: Secondary | ICD-10-CM | POA: Diagnosis not present

## 2017-01-23 ENCOUNTER — Other Ambulatory Visit: Payer: Self-pay | Admitting: *Deleted

## 2017-01-23 DIAGNOSIS — E348 Other specified endocrine disorders: Secondary | ICD-10-CM

## 2017-01-23 DIAGNOSIS — N952 Postmenopausal atrophic vaginitis: Secondary | ICD-10-CM

## 2017-01-23 MED ORDER — ESTRADIOL 10 MCG VA TABS: ORAL_TABLET | VAGINAL | 3 refills | Status: DC

## 2017-01-23 MED ORDER — ESTRADIOL 10 MCG VA TABS: ORAL_TABLET | VAGINAL | 2 refills | Status: DC

## 2017-01-23 MED ORDER — ESTRADIOL 10 MCG VA TABS: ORAL_TABLET | VAGINAL | 9 refills | Status: DC

## 2017-01-23 NOTE — Telephone Encounter (Signed)
Per annual note on 8/34/37  "Vagifem 1 applicator at bedtime 1-2 times weekly" Rx sent.

## 2017-01-24 DIAGNOSIS — D519 Vitamin B12 deficiency anemia, unspecified: Secondary | ICD-10-CM | POA: Diagnosis not present

## 2017-01-30 DIAGNOSIS — S46912A Strain of unspecified muscle, fascia and tendon at shoulder and upper arm level, left arm, initial encounter: Secondary | ICD-10-CM | POA: Diagnosis not present

## 2017-01-31 DIAGNOSIS — H5203 Hypermetropia, bilateral: Secondary | ICD-10-CM | POA: Diagnosis not present

## 2017-02-09 DIAGNOSIS — Z853 Personal history of malignant neoplasm of breast: Secondary | ICD-10-CM | POA: Diagnosis not present

## 2017-02-09 DIAGNOSIS — N650 Deformity of reconstructed breast: Secondary | ICD-10-CM | POA: Diagnosis not present

## 2017-02-09 DIAGNOSIS — Z9012 Acquired absence of left breast and nipple: Secondary | ICD-10-CM | POA: Diagnosis not present

## 2017-02-27 DIAGNOSIS — D510 Vitamin B12 deficiency anemia due to intrinsic factor deficiency: Secondary | ICD-10-CM | POA: Diagnosis not present

## 2017-03-21 DIAGNOSIS — I10 Essential (primary) hypertension: Secondary | ICD-10-CM | POA: Diagnosis not present

## 2017-03-21 DIAGNOSIS — E559 Vitamin D deficiency, unspecified: Secondary | ICD-10-CM | POA: Diagnosis not present

## 2017-03-21 DIAGNOSIS — D510 Vitamin B12 deficiency anemia due to intrinsic factor deficiency: Secondary | ICD-10-CM | POA: Diagnosis not present

## 2017-03-21 DIAGNOSIS — F419 Anxiety disorder, unspecified: Secondary | ICD-10-CM | POA: Diagnosis not present

## 2017-03-21 DIAGNOSIS — R7301 Impaired fasting glucose: Secondary | ICD-10-CM | POA: Diagnosis not present

## 2017-03-21 DIAGNOSIS — Z6823 Body mass index (BMI) 23.0-23.9, adult: Secondary | ICD-10-CM | POA: Diagnosis not present

## 2017-03-21 DIAGNOSIS — E785 Hyperlipidemia, unspecified: Secondary | ICD-10-CM | POA: Diagnosis not present

## 2017-03-29 DIAGNOSIS — D510 Vitamin B12 deficiency anemia due to intrinsic factor deficiency: Secondary | ICD-10-CM | POA: Diagnosis not present

## 2017-04-24 DIAGNOSIS — S39012A Strain of muscle, fascia and tendon of lower back, initial encounter: Secondary | ICD-10-CM | POA: Diagnosis not present

## 2017-04-24 DIAGNOSIS — Z6823 Body mass index (BMI) 23.0-23.9, adult: Secondary | ICD-10-CM | POA: Diagnosis not present

## 2017-04-25 DIAGNOSIS — M40293 Other kyphosis, cervicothoracic region: Secondary | ICD-10-CM | POA: Diagnosis not present

## 2017-04-25 DIAGNOSIS — M545 Low back pain: Secondary | ICD-10-CM | POA: Diagnosis not present

## 2017-05-01 DIAGNOSIS — D510 Vitamin B12 deficiency anemia due to intrinsic factor deficiency: Secondary | ICD-10-CM | POA: Diagnosis not present

## 2017-05-02 DIAGNOSIS — M545 Low back pain: Secondary | ICD-10-CM | POA: Diagnosis not present

## 2017-05-02 DIAGNOSIS — M40293 Other kyphosis, cervicothoracic region: Secondary | ICD-10-CM | POA: Diagnosis not present

## 2017-05-16 DIAGNOSIS — M40293 Other kyphosis, cervicothoracic region: Secondary | ICD-10-CM | POA: Diagnosis not present

## 2017-05-16 DIAGNOSIS — M545 Low back pain: Secondary | ICD-10-CM | POA: Diagnosis not present

## 2017-05-23 DIAGNOSIS — M40293 Other kyphosis, cervicothoracic region: Secondary | ICD-10-CM | POA: Diagnosis not present

## 2017-05-23 DIAGNOSIS — M545 Low back pain: Secondary | ICD-10-CM | POA: Diagnosis not present

## 2017-06-01 DIAGNOSIS — M545 Low back pain: Secondary | ICD-10-CM | POA: Diagnosis not present

## 2017-06-01 DIAGNOSIS — M40293 Other kyphosis, cervicothoracic region: Secondary | ICD-10-CM | POA: Diagnosis not present

## 2017-06-02 DIAGNOSIS — D510 Vitamin B12 deficiency anemia due to intrinsic factor deficiency: Secondary | ICD-10-CM | POA: Diagnosis not present

## 2017-06-19 DIAGNOSIS — Z1231 Encounter for screening mammogram for malignant neoplasm of breast: Secondary | ICD-10-CM | POA: Diagnosis not present

## 2017-06-19 DIAGNOSIS — Z1331 Encounter for screening for depression: Secondary | ICD-10-CM | POA: Diagnosis not present

## 2017-06-19 DIAGNOSIS — Z0000 Encounter for general adult medical examination without abnormal findings: Secondary | ICD-10-CM | POA: Diagnosis not present

## 2017-06-19 DIAGNOSIS — E785 Hyperlipidemia, unspecified: Secondary | ICD-10-CM | POA: Diagnosis not present

## 2017-06-19 DIAGNOSIS — Z9181 History of falling: Secondary | ICD-10-CM | POA: Diagnosis not present

## 2017-06-19 DIAGNOSIS — Z136 Encounter for screening for cardiovascular disorders: Secondary | ICD-10-CM | POA: Diagnosis not present

## 2017-06-19 DIAGNOSIS — N959 Unspecified menopausal and perimenopausal disorder: Secondary | ICD-10-CM | POA: Diagnosis not present

## 2017-06-20 DIAGNOSIS — L728 Other follicular cysts of the skin and subcutaneous tissue: Secondary | ICD-10-CM | POA: Diagnosis not present

## 2017-06-20 DIAGNOSIS — L918 Other hypertrophic disorders of the skin: Secondary | ICD-10-CM | POA: Diagnosis not present

## 2017-06-20 DIAGNOSIS — L570 Actinic keratosis: Secondary | ICD-10-CM | POA: Diagnosis not present

## 2017-07-04 DIAGNOSIS — D510 Vitamin B12 deficiency anemia due to intrinsic factor deficiency: Secondary | ICD-10-CM | POA: Diagnosis not present

## 2017-07-17 DIAGNOSIS — M25551 Pain in right hip: Secondary | ICD-10-CM | POA: Diagnosis not present

## 2017-07-17 DIAGNOSIS — M5416 Radiculopathy, lumbar region: Secondary | ICD-10-CM | POA: Diagnosis not present

## 2017-07-17 DIAGNOSIS — I739 Peripheral vascular disease, unspecified: Secondary | ICD-10-CM | POA: Diagnosis not present

## 2017-07-18 DIAGNOSIS — M545 Low back pain: Secondary | ICD-10-CM | POA: Diagnosis not present

## 2017-07-18 DIAGNOSIS — M25551 Pain in right hip: Secondary | ICD-10-CM | POA: Diagnosis not present

## 2017-07-18 DIAGNOSIS — M25552 Pain in left hip: Secondary | ICD-10-CM | POA: Diagnosis not present

## 2017-07-18 DIAGNOSIS — M5416 Radiculopathy, lumbar region: Secondary | ICD-10-CM | POA: Diagnosis not present

## 2017-07-26 DIAGNOSIS — I739 Peripheral vascular disease, unspecified: Secondary | ICD-10-CM | POA: Diagnosis not present

## 2017-08-04 DIAGNOSIS — D510 Vitamin B12 deficiency anemia due to intrinsic factor deficiency: Secondary | ICD-10-CM | POA: Diagnosis not present

## 2017-08-07 DIAGNOSIS — M47817 Spondylosis without myelopathy or radiculopathy, lumbosacral region: Secondary | ICD-10-CM | POA: Diagnosis not present

## 2017-08-08 DIAGNOSIS — M47817 Spondylosis without myelopathy or radiculopathy, lumbosacral region: Secondary | ICD-10-CM | POA: Diagnosis not present

## 2017-08-08 DIAGNOSIS — M5127 Other intervertebral disc displacement, lumbosacral region: Secondary | ICD-10-CM | POA: Diagnosis not present

## 2017-08-11 ENCOUNTER — Other Ambulatory Visit: Payer: Self-pay

## 2017-08-15 DIAGNOSIS — M545 Low back pain: Secondary | ICD-10-CM | POA: Diagnosis not present

## 2017-08-23 ENCOUNTER — Ambulatory Visit (INDEPENDENT_AMBULATORY_CARE_PROVIDER_SITE_OTHER): Payer: Medicare Other | Admitting: Sports Medicine

## 2017-08-23 ENCOUNTER — Ambulatory Visit (INDEPENDENT_AMBULATORY_CARE_PROVIDER_SITE_OTHER): Payer: Medicare Other

## 2017-08-23 ENCOUNTER — Encounter: Payer: Self-pay | Admitting: Sports Medicine

## 2017-08-23 ENCOUNTER — Other Ambulatory Visit: Payer: Self-pay

## 2017-08-23 ENCOUNTER — Other Ambulatory Visit: Payer: Self-pay | Admitting: Sports Medicine

## 2017-08-23 DIAGNOSIS — M79672 Pain in left foot: Secondary | ICD-10-CM

## 2017-08-23 DIAGNOSIS — M722 Plantar fascial fibromatosis: Secondary | ICD-10-CM

## 2017-08-23 DIAGNOSIS — M79671 Pain in right foot: Secondary | ICD-10-CM

## 2017-08-23 DIAGNOSIS — M79673 Pain in unspecified foot: Secondary | ICD-10-CM

## 2017-08-23 DIAGNOSIS — Q667 Congenital pes cavus, unspecified foot: Secondary | ICD-10-CM

## 2017-08-23 NOTE — Progress Notes (Signed)
Subjective: Tracey Norris is a 72 y.o. female patient who presents to office for evaluation of bilateral foot pain. Patient complains of progressive pain over the years with most pain in the back and also in her upper thighs states that she has custom orthotics made approximately about a month ago where they made several adjustments to help support her high arch patient has questions about her current orthotics and states that as long as she has support it seems to help her feet but she is still dealing with issues with her back and issues with her upper thighs.  Patient denies any warmth, redness, swelling, drainage or any open lesions to the feet or lower extremities.  Patient denies any other pedal complaints. Denies recent injury/trip/fall/sprain/any recent causative factors.   Review of Systems  Musculoskeletal: Positive for back pain, joint pain and myalgias.  All other systems reviewed and are negative.    Patient Active Problem List   Diagnosis Date Noted  . Genetic testing 12/08/2016  . Family history of breast cancer   . Family history of brain cancer   . Hypercholesteremia 09/26/2011  . Breast cancer, left breast ([place]) 04/27/2011  . HTN (hypertension) 02/07/2011  . IBS (irritable bowel syndrome)     Current Outpatient Medications on File Prior to Visit  Medication Sig Dispense Refill  . ALPRAZolam (XANAX) 0.5 MG tablet as needed.  0  . atenolol (TENORMIN) 25 MG tablet Take 25 mg by mouth daily.      . Cholecalciferol (VITAMIN D3) 1000 UNITS CAPS Take 1,000 Units by mouth daily.     . Cyanocobalamin (VITAMIN B-12 IJ) Inject as directed every 30 (thirty) days.    . Estradiol 10 MCG TABS vaginal tablet Insert 1 tablet  at bedtime for 1-2 times week 24 tablet 2  . fluconazole (DIFLUCAN) 150 MG tablet Take 1 tablet (150 mg total) by mouth once. (Patient not taking: Reported on 09/19/2016) 1 tablet 1  . lansoprazole (PREVACID) 15 MG capsule Take 15 mg by mouth every other day.     . nystatin-triamcinolone ointment (MYCOLOG) Apply 1 application topically 2 (two) times daily. (Patient not taking: Reported on 09/19/2016) 30 g 0  . Probiotic Product (PROBIOTIC DAILY) CAPS Take 1 tablet by mouth daily.     . simvastatin (ZOCOR) 20 MG tablet Take 1 tablet by mouth daily.  2   No current facility-administered medications on file prior to visit.     Allergies  Allergen Reactions  . Levofloxacin Other (See Comments)    Muscle cramps and feet cramps    Objective:  General: Alert and oriented x3 in no acute distress  Dermatology: No open lesions bilateral lower extremities, no webspace macerations, no ecchymosis bilateral, all nails x 10 are well manicured.  Vascular: Dorsalis Pedis and Posterior Tibial pedal pulses palpable, Capillary Fill Time 3 seconds,(+) pedal hair growth bilateral, no edema bilateral lower extremities, Temperature gradient within normal limits.  Neurology: Johney Maine sensation intact via light touch bilateral. (- )Tinels sign bilateral.   Musculoskeletal: Minimal tenderness to palpation bilateral.  Previous history of plantar fasciitis.  Pes cavus foot type.  Strength within normal limits in all groups bilateral.   Gait: Non-Antalgic gait  Xrays  Bilateral foot   Impression: Normal osseous mineralization, there is pes cavus foot deformity with increased calcaneal inclination and supination noted, there is inferior and posterior calcaneal spurring left greater than right no other acute findings.  Assessment and Plan: Problem List Items Addressed This Visit    None  Visit Diagnoses    Foot pain, bilateral    -  Primary   Arch pain, unspecified laterality       Plantar fasciitis       Pes cavus         -Complete examination performed -Xrays reviewed -Discussed treatment options for mechanics -Applied lateral padding to right insole to assist with patient feeling of supination -Advised patient to return to am fit for them to adjust her  current orthotics -Advised patient to continue with good supportive shoes daily stretching topical pain rubs creams or icing as needed -Continue with orthopedic follow-up for hip and back -Patient to return to office as needed or sooner if condition worsens.  Landis Martins, DPM

## 2017-09-04 DIAGNOSIS — D510 Vitamin B12 deficiency anemia due to intrinsic factor deficiency: Secondary | ICD-10-CM | POA: Diagnosis not present

## 2017-09-27 DIAGNOSIS — R7301 Impaired fasting glucose: Secondary | ICD-10-CM | POA: Diagnosis not present

## 2017-09-27 DIAGNOSIS — E785 Hyperlipidemia, unspecified: Secondary | ICD-10-CM | POA: Diagnosis not present

## 2017-09-27 DIAGNOSIS — F419 Anxiety disorder, unspecified: Secondary | ICD-10-CM | POA: Diagnosis not present

## 2017-09-27 DIAGNOSIS — I10 Essential (primary) hypertension: Secondary | ICD-10-CM | POA: Diagnosis not present

## 2017-09-27 DIAGNOSIS — D510 Vitamin B12 deficiency anemia due to intrinsic factor deficiency: Secondary | ICD-10-CM | POA: Diagnosis not present

## 2017-09-27 DIAGNOSIS — E559 Vitamin D deficiency, unspecified: Secondary | ICD-10-CM | POA: Diagnosis not present

## 2017-10-05 DIAGNOSIS — D510 Vitamin B12 deficiency anemia due to intrinsic factor deficiency: Secondary | ICD-10-CM | POA: Diagnosis not present

## 2017-10-09 DIAGNOSIS — Z1231 Encounter for screening mammogram for malignant neoplasm of breast: Secondary | ICD-10-CM | POA: Diagnosis not present

## 2017-10-09 DIAGNOSIS — Z853 Personal history of malignant neoplasm of breast: Secondary | ICD-10-CM | POA: Diagnosis not present

## 2017-10-19 DIAGNOSIS — M792 Neuralgia and neuritis, unspecified: Secondary | ICD-10-CM | POA: Diagnosis not present

## 2017-10-19 DIAGNOSIS — M19072 Primary osteoarthritis, left ankle and foot: Secondary | ICD-10-CM | POA: Diagnosis not present

## 2017-10-19 DIAGNOSIS — M19071 Primary osteoarthritis, right ankle and foot: Secondary | ICD-10-CM | POA: Diagnosis not present

## 2017-10-24 DIAGNOSIS — G5601 Carpal tunnel syndrome, right upper limb: Secondary | ICD-10-CM | POA: Diagnosis not present

## 2017-10-24 DIAGNOSIS — M5417 Radiculopathy, lumbosacral region: Secondary | ICD-10-CM | POA: Diagnosis not present

## 2017-10-24 DIAGNOSIS — M5412 Radiculopathy, cervical region: Secondary | ICD-10-CM | POA: Diagnosis not present

## 2017-10-24 DIAGNOSIS — R202 Paresthesia of skin: Secondary | ICD-10-CM | POA: Diagnosis not present

## 2017-10-24 DIAGNOSIS — G2581 Restless legs syndrome: Secondary | ICD-10-CM | POA: Diagnosis not present

## 2017-11-06 DIAGNOSIS — Z23 Encounter for immunization: Secondary | ICD-10-CM | POA: Diagnosis not present

## 2017-11-06 DIAGNOSIS — D510 Vitamin B12 deficiency anemia due to intrinsic factor deficiency: Secondary | ICD-10-CM | POA: Diagnosis not present

## 2017-11-07 DIAGNOSIS — G2581 Restless legs syndrome: Secondary | ICD-10-CM | POA: Diagnosis not present

## 2017-11-07 DIAGNOSIS — M542 Cervicalgia: Secondary | ICD-10-CM | POA: Diagnosis not present

## 2017-11-07 DIAGNOSIS — M5442 Lumbago with sciatica, left side: Secondary | ICD-10-CM | POA: Diagnosis not present

## 2017-11-07 DIAGNOSIS — R202 Paresthesia of skin: Secondary | ICD-10-CM | POA: Diagnosis not present

## 2017-11-07 DIAGNOSIS — M5441 Lumbago with sciatica, right side: Secondary | ICD-10-CM | POA: Diagnosis not present

## 2017-11-20 DIAGNOSIS — F419 Anxiety disorder, unspecified: Secondary | ICD-10-CM | POA: Diagnosis not present

## 2017-11-20 DIAGNOSIS — K219 Gastro-esophageal reflux disease without esophagitis: Secondary | ICD-10-CM | POA: Diagnosis not present

## 2017-11-21 DIAGNOSIS — M4005 Postural kyphosis, thoracolumbar region: Secondary | ICD-10-CM | POA: Diagnosis not present

## 2017-11-21 DIAGNOSIS — M5417 Radiculopathy, lumbosacral region: Secondary | ICD-10-CM | POA: Diagnosis not present

## 2017-11-21 DIAGNOSIS — M545 Low back pain: Secondary | ICD-10-CM | POA: Diagnosis not present

## 2017-11-23 DIAGNOSIS — M545 Low back pain: Secondary | ICD-10-CM | POA: Diagnosis not present

## 2017-11-23 DIAGNOSIS — M4005 Postural kyphosis, thoracolumbar region: Secondary | ICD-10-CM | POA: Diagnosis not present

## 2017-11-23 DIAGNOSIS — M5417 Radiculopathy, lumbosacral region: Secondary | ICD-10-CM | POA: Diagnosis not present

## 2017-11-28 DIAGNOSIS — M4005 Postural kyphosis, thoracolumbar region: Secondary | ICD-10-CM | POA: Diagnosis not present

## 2017-11-28 DIAGNOSIS — M5417 Radiculopathy, lumbosacral region: Secondary | ICD-10-CM | POA: Diagnosis not present

## 2017-11-28 DIAGNOSIS — M545 Low back pain: Secondary | ICD-10-CM | POA: Diagnosis not present

## 2017-11-29 ENCOUNTER — Ambulatory Visit (INDEPENDENT_AMBULATORY_CARE_PROVIDER_SITE_OTHER): Payer: Medicare Other | Admitting: Women's Health

## 2017-11-29 ENCOUNTER — Encounter: Payer: Self-pay | Admitting: Women's Health

## 2017-11-29 VITALS — BP 126/80 | Ht 61.0 in | Wt 120.0 lb

## 2017-11-29 DIAGNOSIS — Z01419 Encounter for gynecological examination (general) (routine) without abnormal findings: Secondary | ICD-10-CM

## 2017-11-29 DIAGNOSIS — Z9289 Personal history of other medical treatment: Secondary | ICD-10-CM | POA: Diagnosis not present

## 2017-11-29 NOTE — Progress Notes (Signed)
Tracey Norris Nov 08, 1945 [id number]    History:    Presents for breast and pelvic exam.  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  2018- BRCA testing.  2018 T score -1.9 right femoral neck FRAX 11% / 1.9%.  Long-term problem with GERD, IBS, stomach issues GI manages.   Primary care manages hypertension, anxiety/depression.  Current on vaccines.. Currently having severe back pain has seen neurologist has had steroid injections and is doing better.  Next colonoscopy due 2020.  Past medical history, past surgical history, family history and social history were all reviewed and documented in the EPIC chart.  Retired [employer]. 2 children all doing well.  ROS:  A ROS was performed and pertinent positives and negatives are included.  Exam:  Vitals:   11/29/17 1402  BP: 126/80  Weight: 120 lb (54.4 kg)  Height: 5' 1"  (1.549 m)   Body mass index is 22.67 kg/m.   General appearance:  Normal Thyroid:  Symmetrical, normal in size, without palpable masses or nodularity. Respiratory  Auscultation:  Clear without wheezing or rhonchi Cardiovascular  Auscultation:  Regular rate, without rubs, murmurs or gallops  Edema/varicosities:  Not grossly evident Abdominal  Soft,nontender, without masses, guarding or rebound.  Liver/spleen:  No organomegaly noted  Hernia:  None appreciated  Skin  Inspection:  Grossly normal   Breasts: Examined lying and sitting.     Right: Without masses, retractions, discharge or axillary adenopathy.     Left: Without masses, retractions, discharge or axillary adenopathy. Gentitourinary   Inguinal/mons:  Normal without inguinal adenopathy  External genitalia:  Normal  BUS/Urethra/Skene's glands:  Normal  Vagina:  Normal  Cervix: Absent  Uterus: Absent adnexa/parametria:     Rt: Without masses or tenderness.   Lt: Without masses or tenderness.  Anus and perineum: Normal  Digital rectal exam: Normal  sphincter tone without palpated masses or tenderness  Assessment/Plan:  72 y.o. MWF G2, P2 for breast and pelvic exam.   TVH on no HRT Breast cancer with reconstructive surgery /negative BRCA Osteopenia without elevated FRAX Primary care manages labs and meds for hypertension, anxiety and depression  Plan: SBE's, continue annual screening mammogram, calcium rich foods, vitamin D as prescribed by primary care.  Reviewed importance of weightbearing and balance type exercise yoga encouraged.   Huel Cote [facility], 2:11 PM 11/29/2017

## 2017-11-29 NOTE — Patient Instructions (Signed)
Health Maintenance for Postmenopausal Women Menopause is a normal process in which your reproductive ability comes to an end. This process happens gradually over a span of months to years, usually between the ages of 22 and 9. Menopause is complete when you have missed 12 consecutive menstrual periods. It is important to talk with your health care provider about some of the most common conditions that affect postmenopausal women, such as heart disease, cancer, and bone loss (osteoporosis). Adopting a healthy lifestyle and getting preventive care can help to promote your health and wellness. Those actions can also lower your chances of developing some of these common conditions. What should I know about menopause? During menopause, you may experience a number of symptoms, such as:  Moderate-to-severe hot flashes.  Night sweats.  Decrease in sex drive.  Mood swings.  Headaches.  Tiredness.  Irritability.  Memory problems.  Insomnia.  Choosing to treat or not to treat menopausal changes is an individual decision that you make with your health care provider. What should I know about hormone replacement therapy and supplements? Hormone therapy products are effective for treating symptoms that are associated with menopause, such as hot flashes and night sweats. Hormone replacement carries certain risks, especially as you become older. If you are thinking about using estrogen or estrogen with progestin treatments, discuss the benefits and risks with your health care provider. What should I know about heart disease and stroke? Heart disease, heart attack, and stroke become more likely as you age. This may be due, in part, to the hormonal changes that your body experiences during menopause. These can affect how your body processes dietary fats, triglycerides, and cholesterol. Heart attack and stroke are both medical emergencies. There are many things that you can do to help prevent heart disease  and stroke:  Have your blood pressure checked at least every 1-2 years. High blood pressure causes heart disease and increases the risk of stroke.  If you are 53-22 years old, ask your health care provider if you should take aspirin to prevent a heart attack or a stroke.  Do not use any tobacco products, including cigarettes, chewing tobacco, or electronic cigarettes. If you need help quitting, ask your health care provider.  It is important to eat a healthy diet and maintain a healthy weight. ? Be sure to include plenty of vegetables, fruits, low-fat dairy products, and lean protein. ? Avoid eating foods that are high in solid fats, added sugars, or salt (sodium).  Get regular exercise. This is one of the most important things that you can do for your health. ? Try to exercise for at least 150 minutes each week. The type of exercise that you do should increase your heart rate and make you sweat. This is known as moderate-intensity exercise. ? Try to do strengthening exercises at least twice each week. Do these in addition to the moderate-intensity exercise.  Know your numbers.Ask your health care provider to check your cholesterol and your blood glucose. Continue to have your blood tested as directed by your health care provider.  What should I know about cancer screening? There are several types of cancer. Take the following steps to reduce your risk and to catch any cancer development as early as possible. Breast Cancer  Practice breast self-awareness. ? This means understanding how your breasts normally appear and feel. ? It also means doing regular breast self-exams. Let your health care provider know about any changes, no matter how small.  If you are 40  or older, have a clinician do a breast exam (clinical breast exam or CBE) every year. Depending on your age, family history, and medical history, it may be recommended that you also have a yearly breast X-ray (mammogram).  If you  have a family history of breast cancer, talk with your health care provider about genetic screening.  If you are at high risk for breast cancer, talk with your health care provider about having an MRI and a mammogram every year.  Breast cancer (BRCA) gene test is recommended for women who have family members with BRCA-related cancers. Results of the assessment will determine the need for genetic counseling and BRCA1 and for BRCA2 testing. BRCA-related cancers include these types: ? Breast. This occurs in males or females. ? Ovarian. ? Tubal. This may also be called fallopian tube cancer. ? Cancer of the abdominal or pelvic lining (peritoneal cancer). ? Prostate. ? Pancreatic.  Cervical, Uterine, and Ovarian Cancer Your health care provider may recommend that you be screened regularly for cancer of the pelvic organs. These include your ovaries, uterus, and vagina. This screening involves a pelvic exam, which includes checking for microscopic changes to the surface of your cervix (Pap test).  For women ages 21-65, health care providers may recommend a pelvic exam and a Pap test every three years. For women ages 79-65, they may recommend the Pap test and pelvic exam, combined with testing for human papilloma virus (HPV), every five years. Some types of HPV increase your risk of cervical cancer. Testing for HPV may also be done on women of any age who have unclear Pap test results.  Other health care providers may not recommend any screening for nonpregnant women who are considered low risk for pelvic cancer and have no symptoms. Ask your health care provider if a screening pelvic exam is right for you.  If you have had past treatment for cervical cancer or a condition that could lead to cancer, you need Pap tests and screening for cancer for at least 20 years after your treatment. If Pap tests have been discontinued for you, your risk factors (such as having a new sexual partner) need to be  reassessed to determine if you should start having screenings again. Some women have medical problems that increase the chance of getting cervical cancer. In these cases, your health care provider may recommend that you have screening and Pap tests more often.  If you have a family history of uterine cancer or ovarian cancer, talk with your health care provider about genetic screening.  If you have vaginal bleeding after reaching menopause, tell your health care provider.  There are currently no reliable tests available to screen for ovarian cancer.  Lung Cancer Lung cancer screening is recommended for adults 69-62 years old who are at high risk for lung cancer because of a history of smoking. A yearly low-dose CT scan of the lungs is recommended if you:  Currently smoke.  Have a history of at least 30 pack-years of smoking and you currently smoke or have quit within the past 15 years. A pack-year is smoking an average of one pack of cigarettes per day for one year.  Yearly screening should:  Continue until it has been 15 years since you quit.  Stop if you develop a health problem that would prevent you from having lung cancer treatment.  Colorectal Cancer  This type of cancer can be detected and can often be prevented.  Routine colorectal cancer screening usually begins at  age 42 and continues through age 45.  If you have risk factors for colon cancer, your health care provider may recommend that you be screened at an earlier age.  If you have a family history of colorectal cancer, talk with your health care provider about genetic screening.  Your health care provider may also recommend using home test kits to check for hidden blood in your stool.  A small camera at the end of a tube can be used to examine your colon directly (sigmoidoscopy or colonoscopy). This is done to check for the earliest forms of colorectal cancer.  Direct examination of the colon should be repeated every  5-10 years until age 71. However, if early forms of precancerous polyps or small growths are found or if you have a family history or genetic risk for colorectal cancer, you may need to be screened more often.  Skin Cancer  Check your skin from head to toe regularly.  Monitor any moles. Be sure to tell your health care provider: ? About any new moles or changes in moles, especially if there is a change in a mole's shape or color. ? If you have a mole that is larger than the size of a pencil eraser.  If any of your family members has a history of skin cancer, especially at a young age, talk with your health care provider about genetic screening.  Always use sunscreen. Apply sunscreen liberally and repeatedly throughout the day.  Whenever you are outside, protect yourself by wearing long sleeves, pants, a wide-brimmed hat, and sunglasses.  What should I know about osteoporosis? Osteoporosis is a condition in which bone destruction happens more quickly than new bone creation. After menopause, you may be at an increased risk for osteoporosis. To help prevent osteoporosis or the bone fractures that can happen because of osteoporosis, the following is recommended:  If you are 46-71 years old, get at least 1,000 mg of calcium and at least 600 mg of vitamin D per day.  If you are older than age 55 but younger than age 65, get at least 1,200 mg of calcium and at least 600 mg of vitamin D per day.  If you are older than age 54, get at least 1,200 mg of calcium and at least 800 mg of vitamin D per day.  Smoking and excessive alcohol intake increase the risk of osteoporosis. Eat foods that are rich in calcium and vitamin D, and do weight-bearing exercises several times each week as directed by your health care provider. What should I know about how menopause affects my mental health? Depression may occur at any age, but it is more common as you become older. Common symptoms of depression  include:  Low or sad mood.  Changes in sleep patterns.  Changes in appetite or eating patterns.  Feeling an overall lack of motivation or enjoyment of activities that you previously enjoyed.  Frequent crying spells.  Talk with your health care provider if you think that you are experiencing depression. What should I know about immunizations? It is important that you get and maintain your immunizations. These include:  Tetanus, diphtheria, and pertussis (Tdap) booster vaccine.  Influenza every year before the flu season begins.  Pneumonia vaccine.  Shingles vaccine.  Your health care provider may also recommend other immunizations. This information is not intended to replace advice given to you by your health care provider. Make sure you discuss any questions you have with your health care provider. Document Released: 02/18/2005  Document Revised: 07/17/2015 Document Reviewed: 09/30/2014 Elsevier Interactive Patient Education  2018 Elsevier Inc.  

## 2017-12-08 DIAGNOSIS — D510 Vitamin B12 deficiency anemia due to intrinsic factor deficiency: Secondary | ICD-10-CM | POA: Diagnosis not present

## 2017-12-25 DIAGNOSIS — F419 Anxiety disorder, unspecified: Secondary | ICD-10-CM | POA: Diagnosis not present

## 2017-12-25 DIAGNOSIS — Z6822 Body mass index (BMI) 22.0-22.9, adult: Secondary | ICD-10-CM | POA: Diagnosis not present

## 2018-01-08 DIAGNOSIS — D510 Vitamin B12 deficiency anemia due to intrinsic factor deficiency: Secondary | ICD-10-CM | POA: Diagnosis not present

## 2018-02-09 DIAGNOSIS — D510 Vitamin B12 deficiency anemia due to intrinsic factor deficiency: Secondary | ICD-10-CM | POA: Diagnosis not present

## 2018-02-21 DIAGNOSIS — M545 Low back pain: Secondary | ICD-10-CM | POA: Diagnosis not present

## 2018-02-26 DIAGNOSIS — M4004 Postural kyphosis, thoracic region: Secondary | ICD-10-CM | POA: Diagnosis not present

## 2018-02-26 DIAGNOSIS — M4005 Postural kyphosis, thoracolumbar region: Secondary | ICD-10-CM | POA: Diagnosis not present

## 2018-02-26 DIAGNOSIS — M545 Low back pain: Secondary | ICD-10-CM | POA: Diagnosis not present

## 2018-03-01 DIAGNOSIS — M4004 Postural kyphosis, thoracic region: Secondary | ICD-10-CM | POA: Diagnosis not present

## 2018-03-01 DIAGNOSIS — M545 Low back pain: Secondary | ICD-10-CM | POA: Diagnosis not present

## 2018-03-01 DIAGNOSIS — M4005 Postural kyphosis, thoracolumbar region: Secondary | ICD-10-CM | POA: Diagnosis not present

## 2018-03-07 DIAGNOSIS — M545 Low back pain: Secondary | ICD-10-CM | POA: Diagnosis not present

## 2018-03-07 DIAGNOSIS — M4004 Postural kyphosis, thoracic region: Secondary | ICD-10-CM | POA: Diagnosis not present

## 2018-03-07 DIAGNOSIS — H16223 Keratoconjunctivitis sicca, not specified as Sjogren's, bilateral: Secondary | ICD-10-CM | POA: Diagnosis not present

## 2018-03-07 DIAGNOSIS — M4005 Postural kyphosis, thoracolumbar region: Secondary | ICD-10-CM | POA: Diagnosis not present

## 2018-03-07 DIAGNOSIS — H2513 Age-related nuclear cataract, bilateral: Secondary | ICD-10-CM | POA: Diagnosis not present

## 2018-03-08 DIAGNOSIS — Z853 Personal history of malignant neoplasm of breast: Secondary | ICD-10-CM | POA: Diagnosis not present

## 2018-03-08 DIAGNOSIS — Z08 Encounter for follow-up examination after completed treatment for malignant neoplasm: Secondary | ICD-10-CM | POA: Diagnosis not present

## 2018-03-08 DIAGNOSIS — Z9012 Acquired absence of left breast and nipple: Secondary | ICD-10-CM | POA: Diagnosis not present

## 2018-03-09 DIAGNOSIS — M545 Low back pain: Secondary | ICD-10-CM | POA: Diagnosis not present

## 2018-03-09 DIAGNOSIS — M4005 Postural kyphosis, thoracolumbar region: Secondary | ICD-10-CM | POA: Diagnosis not present

## 2018-03-09 DIAGNOSIS — M4004 Postural kyphosis, thoracic region: Secondary | ICD-10-CM | POA: Diagnosis not present

## 2018-03-12 DIAGNOSIS — M4005 Postural kyphosis, thoracolumbar region: Secondary | ICD-10-CM | POA: Diagnosis not present

## 2018-03-12 DIAGNOSIS — M545 Low back pain: Secondary | ICD-10-CM | POA: Diagnosis not present

## 2018-03-12 DIAGNOSIS — D510 Vitamin B12 deficiency anemia due to intrinsic factor deficiency: Secondary | ICD-10-CM | POA: Diagnosis not present

## 2018-03-12 DIAGNOSIS — M4004 Postural kyphosis, thoracic region: Secondary | ICD-10-CM | POA: Diagnosis not present

## 2018-03-15 DIAGNOSIS — M545 Low back pain: Secondary | ICD-10-CM | POA: Diagnosis not present

## 2018-03-19 DIAGNOSIS — M545 Low back pain: Secondary | ICD-10-CM | POA: Diagnosis not present

## 2018-03-22 DIAGNOSIS — M545 Low back pain: Secondary | ICD-10-CM | POA: Diagnosis not present

## 2018-04-04 DIAGNOSIS — M545 Low back pain: Secondary | ICD-10-CM | POA: Diagnosis not present

## 2018-04-13 DIAGNOSIS — D510 Vitamin B12 deficiency anemia due to intrinsic factor deficiency: Secondary | ICD-10-CM | POA: Diagnosis not present

## 2018-04-23 ENCOUNTER — Other Ambulatory Visit: Payer: Self-pay | Admitting: Women's Health

## 2018-04-23 DIAGNOSIS — N952 Postmenopausal atrophic vaginitis: Secondary | ICD-10-CM

## 2018-04-23 DIAGNOSIS — E348 Other specified endocrine disorders: Secondary | ICD-10-CM

## 2018-04-23 NOTE — Telephone Encounter (Signed)
Yes, refills okay if desires.

## 2018-04-23 NOTE — Telephone Encounter (Signed)
annual exam was on 11/29/17 was patient to have refills x 1 year?

## 2018-05-14 DIAGNOSIS — D510 Vitamin B12 deficiency anemia due to intrinsic factor deficiency: Secondary | ICD-10-CM | POA: Diagnosis not present

## 2018-05-29 DIAGNOSIS — M545 Low back pain: Secondary | ICD-10-CM | POA: Diagnosis not present

## 2018-05-31 DIAGNOSIS — H2513 Age-related nuclear cataract, bilateral: Secondary | ICD-10-CM | POA: Diagnosis not present

## 2018-05-31 DIAGNOSIS — H2511 Age-related nuclear cataract, right eye: Secondary | ICD-10-CM | POA: Diagnosis not present

## 2018-05-31 DIAGNOSIS — H25013 Cortical age-related cataract, bilateral: Secondary | ICD-10-CM | POA: Diagnosis not present

## 2018-05-31 DIAGNOSIS — H18413 Arcus senilis, bilateral: Secondary | ICD-10-CM | POA: Diagnosis not present

## 2018-05-31 DIAGNOSIS — H25043 Posterior subcapsular polar age-related cataract, bilateral: Secondary | ICD-10-CM | POA: Diagnosis not present

## 2018-06-05 DIAGNOSIS — M545 Low back pain: Secondary | ICD-10-CM | POA: Diagnosis not present

## 2018-06-12 DIAGNOSIS — M545 Low back pain: Secondary | ICD-10-CM | POA: Diagnosis not present

## 2018-06-14 DIAGNOSIS — D510 Vitamin B12 deficiency anemia due to intrinsic factor deficiency: Secondary | ICD-10-CM | POA: Diagnosis not present

## 2018-06-19 DIAGNOSIS — R7301 Impaired fasting glucose: Secondary | ICD-10-CM | POA: Diagnosis not present

## 2018-06-19 DIAGNOSIS — I10 Essential (primary) hypertension: Secondary | ICD-10-CM | POA: Diagnosis not present

## 2018-06-19 DIAGNOSIS — E785 Hyperlipidemia, unspecified: Secondary | ICD-10-CM | POA: Diagnosis not present

## 2018-06-19 DIAGNOSIS — F419 Anxiety disorder, unspecified: Secondary | ICD-10-CM | POA: Diagnosis not present

## 2018-06-19 DIAGNOSIS — D510 Vitamin B12 deficiency anemia due to intrinsic factor deficiency: Secondary | ICD-10-CM | POA: Diagnosis not present

## 2018-06-19 DIAGNOSIS — E559 Vitamin D deficiency, unspecified: Secondary | ICD-10-CM | POA: Diagnosis not present

## 2018-06-20 DIAGNOSIS — M545 Low back pain: Secondary | ICD-10-CM | POA: Diagnosis not present

## 2018-06-21 DIAGNOSIS — Z1231 Encounter for screening mammogram for malignant neoplasm of breast: Secondary | ICD-10-CM | POA: Diagnosis not present

## 2018-06-21 DIAGNOSIS — E785 Hyperlipidemia, unspecified: Secondary | ICD-10-CM | POA: Diagnosis not present

## 2018-06-21 DIAGNOSIS — Z1331 Encounter for screening for depression: Secondary | ICD-10-CM | POA: Diagnosis not present

## 2018-06-21 DIAGNOSIS — Z1211 Encounter for screening for malignant neoplasm of colon: Secondary | ICD-10-CM | POA: Diagnosis not present

## 2018-06-21 DIAGNOSIS — Z9181 History of falling: Secondary | ICD-10-CM | POA: Diagnosis not present

## 2018-06-21 DIAGNOSIS — Z0000 Encounter for general adult medical examination without abnormal findings: Secondary | ICD-10-CM | POA: Diagnosis not present

## 2018-06-27 DIAGNOSIS — M545 Low back pain: Secondary | ICD-10-CM | POA: Diagnosis not present

## 2018-07-04 DIAGNOSIS — M545 Low back pain: Secondary | ICD-10-CM | POA: Diagnosis not present

## 2018-07-11 DIAGNOSIS — M545 Low back pain: Secondary | ICD-10-CM | POA: Diagnosis not present

## 2018-07-16 DIAGNOSIS — D510 Vitamin B12 deficiency anemia due to intrinsic factor deficiency: Secondary | ICD-10-CM | POA: Diagnosis not present

## 2018-07-18 DIAGNOSIS — M545 Low back pain: Secondary | ICD-10-CM | POA: Diagnosis not present

## 2018-07-25 DIAGNOSIS — M545 Low back pain: Secondary | ICD-10-CM | POA: Diagnosis not present

## 2018-08-16 DIAGNOSIS — D510 Vitamin B12 deficiency anemia due to intrinsic factor deficiency: Secondary | ICD-10-CM | POA: Diagnosis not present

## 2018-09-18 DIAGNOSIS — D510 Vitamin B12 deficiency anemia due to intrinsic factor deficiency: Secondary | ICD-10-CM | POA: Diagnosis not present

## 2018-10-17 ENCOUNTER — Encounter: Payer: Self-pay | Admitting: Gynecology

## 2018-10-19 DIAGNOSIS — D510 Vitamin B12 deficiency anemia due to intrinsic factor deficiency: Secondary | ICD-10-CM | POA: Diagnosis not present

## 2018-10-19 DIAGNOSIS — Z23 Encounter for immunization: Secondary | ICD-10-CM | POA: Diagnosis not present

## 2018-10-29 DIAGNOSIS — H2511 Age-related nuclear cataract, right eye: Secondary | ICD-10-CM | POA: Diagnosis not present

## 2018-10-30 DIAGNOSIS — H2512 Age-related nuclear cataract, left eye: Secondary | ICD-10-CM | POA: Diagnosis not present

## 2018-11-12 DIAGNOSIS — H2512 Age-related nuclear cataract, left eye: Secondary | ICD-10-CM | POA: Diagnosis not present

## 2018-11-19 DIAGNOSIS — D510 Vitamin B12 deficiency anemia due to intrinsic factor deficiency: Secondary | ICD-10-CM | POA: Diagnosis not present

## 2018-12-20 DIAGNOSIS — L821 Other seborrheic keratosis: Secondary | ICD-10-CM | POA: Diagnosis not present

## 2018-12-20 DIAGNOSIS — E785 Hyperlipidemia, unspecified: Secondary | ICD-10-CM | POA: Diagnosis not present

## 2018-12-20 DIAGNOSIS — I10 Essential (primary) hypertension: Secondary | ICD-10-CM | POA: Diagnosis not present

## 2018-12-20 DIAGNOSIS — Z139 Encounter for screening, unspecified: Secondary | ICD-10-CM | POA: Diagnosis not present

## 2018-12-20 DIAGNOSIS — R7301 Impaired fasting glucose: Secondary | ICD-10-CM | POA: Diagnosis not present

## 2018-12-20 DIAGNOSIS — E559 Vitamin D deficiency, unspecified: Secondary | ICD-10-CM | POA: Diagnosis not present

## 2018-12-20 DIAGNOSIS — F419 Anxiety disorder, unspecified: Secondary | ICD-10-CM | POA: Diagnosis not present

## 2018-12-20 DIAGNOSIS — D510 Vitamin B12 deficiency anemia due to intrinsic factor deficiency: Secondary | ICD-10-CM | POA: Diagnosis not present

## 2018-12-20 DIAGNOSIS — L578 Other skin changes due to chronic exposure to nonionizing radiation: Secondary | ICD-10-CM | POA: Diagnosis not present

## 2018-12-20 DIAGNOSIS — L820 Inflamed seborrheic keratosis: Secondary | ICD-10-CM | POA: Diagnosis not present

## 2018-12-28 ENCOUNTER — Encounter: Payer: Self-pay | Admitting: Women's Health

## 2019-01-21 DIAGNOSIS — D510 Vitamin B12 deficiency anemia due to intrinsic factor deficiency: Secondary | ICD-10-CM | POA: Diagnosis not present

## 2019-02-22 DIAGNOSIS — D510 Vitamin B12 deficiency anemia due to intrinsic factor deficiency: Secondary | ICD-10-CM | POA: Diagnosis not present

## 2019-03-14 DIAGNOSIS — M545 Low back pain: Secondary | ICD-10-CM | POA: Diagnosis not present

## 2019-03-14 DIAGNOSIS — G8929 Other chronic pain: Secondary | ICD-10-CM | POA: Diagnosis not present

## 2019-03-20 DIAGNOSIS — M545 Low back pain: Secondary | ICD-10-CM | POA: Diagnosis not present

## 2019-03-25 DIAGNOSIS — D510 Vitamin B12 deficiency anemia due to intrinsic factor deficiency: Secondary | ICD-10-CM | POA: Diagnosis not present

## 2019-04-18 DIAGNOSIS — Z08 Encounter for follow-up examination after completed treatment for malignant neoplasm: Secondary | ICD-10-CM | POA: Diagnosis not present

## 2019-04-18 DIAGNOSIS — Z853 Personal history of malignant neoplasm of breast: Secondary | ICD-10-CM | POA: Diagnosis not present

## 2019-04-18 DIAGNOSIS — Z9012 Acquired absence of left breast and nipple: Secondary | ICD-10-CM | POA: Diagnosis not present

## 2019-04-25 DIAGNOSIS — D510 Vitamin B12 deficiency anemia due to intrinsic factor deficiency: Secondary | ICD-10-CM | POA: Diagnosis not present

## 2019-05-27 DIAGNOSIS — D510 Vitamin B12 deficiency anemia due to intrinsic factor deficiency: Secondary | ICD-10-CM | POA: Diagnosis not present

## 2019-06-20 DIAGNOSIS — D510 Vitamin B12 deficiency anemia due to intrinsic factor deficiency: Secondary | ICD-10-CM | POA: Diagnosis not present

## 2019-06-20 DIAGNOSIS — I10 Essential (primary) hypertension: Secondary | ICD-10-CM | POA: Diagnosis not present

## 2019-06-20 DIAGNOSIS — E785 Hyperlipidemia, unspecified: Secondary | ICD-10-CM | POA: Diagnosis not present

## 2019-06-20 DIAGNOSIS — E559 Vitamin D deficiency, unspecified: Secondary | ICD-10-CM | POA: Diagnosis not present

## 2019-06-20 DIAGNOSIS — F419 Anxiety disorder, unspecified: Secondary | ICD-10-CM | POA: Diagnosis not present

## 2019-06-20 DIAGNOSIS — R7301 Impaired fasting glucose: Secondary | ICD-10-CM | POA: Diagnosis not present

## 2019-06-26 DIAGNOSIS — Z0000 Encounter for general adult medical examination without abnormal findings: Secondary | ICD-10-CM | POA: Diagnosis not present

## 2019-06-26 DIAGNOSIS — Z9181 History of falling: Secondary | ICD-10-CM | POA: Diagnosis not present

## 2019-06-26 DIAGNOSIS — Z1331 Encounter for screening for depression: Secondary | ICD-10-CM | POA: Diagnosis not present

## 2019-06-26 DIAGNOSIS — E785 Hyperlipidemia, unspecified: Secondary | ICD-10-CM | POA: Diagnosis not present

## 2019-06-27 DIAGNOSIS — D519 Vitamin B12 deficiency anemia, unspecified: Secondary | ICD-10-CM | POA: Diagnosis not present

## 2019-07-25 DIAGNOSIS — Z79899 Other long term (current) drug therapy: Secondary | ICD-10-CM | POA: Diagnosis not present

## 2019-07-25 DIAGNOSIS — F419 Anxiety disorder, unspecified: Secondary | ICD-10-CM | POA: Diagnosis not present

## 2019-07-30 DIAGNOSIS — D510 Vitamin B12 deficiency anemia due to intrinsic factor deficiency: Secondary | ICD-10-CM | POA: Diagnosis not present

## 2019-08-15 DIAGNOSIS — K219 Gastro-esophageal reflux disease without esophagitis: Secondary | ICD-10-CM | POA: Diagnosis not present

## 2019-08-15 DIAGNOSIS — Z1211 Encounter for screening for malignant neoplasm of colon: Secondary | ICD-10-CM | POA: Diagnosis not present

## 2019-08-15 DIAGNOSIS — K581 Irritable bowel syndrome with constipation: Secondary | ICD-10-CM | POA: Diagnosis not present

## 2019-08-21 DIAGNOSIS — R0602 Shortness of breath: Secondary | ICD-10-CM | POA: Diagnosis not present

## 2019-08-21 DIAGNOSIS — I498 Other specified cardiac arrhythmias: Secondary | ICD-10-CM | POA: Diagnosis not present

## 2019-08-21 DIAGNOSIS — E7800 Pure hypercholesterolemia, unspecified: Secondary | ICD-10-CM | POA: Diagnosis not present

## 2019-08-21 DIAGNOSIS — Z723 Lack of physical exercise: Secondary | ICD-10-CM | POA: Diagnosis not present

## 2019-08-21 DIAGNOSIS — I10 Essential (primary) hypertension: Secondary | ICD-10-CM | POA: Diagnosis not present

## 2019-08-30 DIAGNOSIS — F419 Anxiety disorder, unspecified: Secondary | ICD-10-CM | POA: Diagnosis not present

## 2019-08-30 DIAGNOSIS — D510 Vitamin B12 deficiency anemia due to intrinsic factor deficiency: Secondary | ICD-10-CM | POA: Diagnosis not present

## 2019-10-01 DIAGNOSIS — F419 Anxiety disorder, unspecified: Secondary | ICD-10-CM | POA: Diagnosis not present

## 2019-10-01 DIAGNOSIS — D510 Vitamin B12 deficiency anemia due to intrinsic factor deficiency: Secondary | ICD-10-CM | POA: Diagnosis not present

## 2019-10-22 DIAGNOSIS — K449 Diaphragmatic hernia without obstruction or gangrene: Secondary | ICD-10-CM | POA: Diagnosis not present

## 2019-10-22 DIAGNOSIS — K219 Gastro-esophageal reflux disease without esophagitis: Secondary | ICD-10-CM | POA: Diagnosis not present

## 2019-10-22 DIAGNOSIS — Z1211 Encounter for screening for malignant neoplasm of colon: Secondary | ICD-10-CM | POA: Diagnosis not present

## 2019-11-01 DIAGNOSIS — Z23 Encounter for immunization: Secondary | ICD-10-CM | POA: Diagnosis not present

## 2019-11-01 DIAGNOSIS — D510 Vitamin B12 deficiency anemia due to intrinsic factor deficiency: Secondary | ICD-10-CM | POA: Diagnosis not present

## 2019-11-28 DIAGNOSIS — Z23 Encounter for immunization: Secondary | ICD-10-CM | POA: Diagnosis not present

## 2019-12-03 DIAGNOSIS — D510 Vitamin B12 deficiency anemia due to intrinsic factor deficiency: Secondary | ICD-10-CM | POA: Diagnosis not present

## 2019-12-09 DIAGNOSIS — M5416 Radiculopathy, lumbar region: Secondary | ICD-10-CM | POA: Diagnosis not present

## 2019-12-10 DIAGNOSIS — M545 Low back pain, unspecified: Secondary | ICD-10-CM | POA: Diagnosis not present

## 2019-12-10 DIAGNOSIS — M5416 Radiculopathy, lumbar region: Secondary | ICD-10-CM | POA: Diagnosis not present

## 2019-12-18 DIAGNOSIS — M545 Low back pain, unspecified: Secondary | ICD-10-CM | POA: Diagnosis not present

## 2019-12-20 DIAGNOSIS — R5382 Chronic fatigue, unspecified: Secondary | ICD-10-CM | POA: Diagnosis not present

## 2019-12-20 DIAGNOSIS — M79671 Pain in right foot: Secondary | ICD-10-CM | POA: Diagnosis not present

## 2019-12-20 DIAGNOSIS — F419 Anxiety disorder, unspecified: Secondary | ICD-10-CM | POA: Diagnosis not present

## 2019-12-20 DIAGNOSIS — Z1231 Encounter for screening mammogram for malignant neoplasm of breast: Secondary | ICD-10-CM | POA: Diagnosis not present

## 2019-12-20 DIAGNOSIS — D510 Vitamin B12 deficiency anemia due to intrinsic factor deficiency: Secondary | ICD-10-CM | POA: Diagnosis not present

## 2019-12-20 DIAGNOSIS — M79672 Pain in left foot: Secondary | ICD-10-CM | POA: Diagnosis not present

## 2019-12-20 DIAGNOSIS — R7301 Impaired fasting glucose: Secondary | ICD-10-CM | POA: Diagnosis not present

## 2019-12-20 DIAGNOSIS — E559 Vitamin D deficiency, unspecified: Secondary | ICD-10-CM | POA: Diagnosis not present

## 2019-12-20 DIAGNOSIS — E785 Hyperlipidemia, unspecified: Secondary | ICD-10-CM | POA: Diagnosis not present

## 2019-12-20 DIAGNOSIS — I10 Essential (primary) hypertension: Secondary | ICD-10-CM | POA: Diagnosis not present

## 2019-12-30 ENCOUNTER — Encounter: Payer: Self-pay | Admitting: Nurse Practitioner

## 2019-12-30 ENCOUNTER — Ambulatory Visit (INDEPENDENT_AMBULATORY_CARE_PROVIDER_SITE_OTHER): Payer: Medicare Other | Admitting: Nurse Practitioner

## 2019-12-30 ENCOUNTER — Other Ambulatory Visit: Payer: Self-pay

## 2019-12-30 VITALS — BP 144/80 | Ht 61.0 in | Wt 126.0 lb

## 2019-12-30 DIAGNOSIS — Z01419 Encounter for gynecological examination (general) (routine) without abnormal findings: Secondary | ICD-10-CM

## 2019-12-30 DIAGNOSIS — Z853 Personal history of malignant neoplasm of breast: Secondary | ICD-10-CM

## 2019-12-30 DIAGNOSIS — Z780 Asymptomatic menopausal state: Secondary | ICD-10-CM

## 2019-12-30 DIAGNOSIS — Z90710 Acquired absence of both cervix and uterus: Secondary | ICD-10-CM

## 2019-12-30 DIAGNOSIS — N951 Menopausal and female climacteric states: Secondary | ICD-10-CM

## 2019-12-30 DIAGNOSIS — M8589 Other specified disorders of bone density and structure, multiple sites: Secondary | ICD-10-CM

## 2019-12-30 NOTE — Progress Notes (Signed)
   Folashade Gamboa 1945/05/05 [id number]   History:  74 y.o. G2P2 presents for breast and pelvic exam. 1985 TVH for menorrhagia, no HRT but does use Yuvafem about 3 times per month with good relief of vaginal dryness. 1985 left breast cancer at age 21. BRCA negative. Normal pap history. Osteopenia.   Gynecologic History No LMP recorded. Patient has had a hysterectomy.   Contraception: status post hysterectomy Last Pap: No longer screening per guidelines Last mammogram: 12/28/2018. Results were: normal Last colonoscopy: 10/2019. Results were: normal Last Dexa: 10/11/2016. Results were: t-score -1.9, FRAX 11% / 1.9%  Past medical history, past surgical history, family history and social history were all reviewed and documented in the EPIC chart.  ROS:  A ROS was performed and pertinent positives and negatives are included.  Exam:  Vitals:   12/30/19 1510  BP: (!) 144/80  Weight: 126 lb (57.2 kg)  Height: $Remove'5\' 1"'mnoMSKe$  (1.549 m)   Body mass index is 23.81 kg/m.  General appearance:  Normal Thyroid:  Symmetrical, normal in size, without palpable masses or nodularity. Respiratory  Auscultation:  Clear without wheezing or rhonchi Cardiovascular  Auscultation:  Regular rate, without rubs, murmurs or gallops  Edema/varicosities:  Not grossly evident Abdominal  Soft,nontender, without masses, guarding or rebound.  Liver/spleen:  No organomegaly noted  Hernia:  None appreciated  Skin  Inspection:  Grossly normal   Breasts: Examined lying and sitting.   Right: Without masses, retractions, discharge or axillary adenopathy.   Left: Without masses, retractions, discharge or axillary adenopathy. Breast implant. Gentitourinary   Inguinal/mons:  Normal without inguinal adenopathy  External genitalia:  Normal  BUS/Urethra/Skene's glands:  Normal  Vagina:  Normal  Cervix:  Absent  Uterus:  Absent  Adnexa/parametria:     Rt: Without masses or tenderness.   Lt: Without masses or  tenderness.  Anus and perineum: Normal  Digital rectal exam: Normal sphincter tone without palpated masses or tenderness  Assessment/Plan:  74 y.o. G2P2 for breast and pelvic exam.   Well female exam with routine gynecological exam - Education provided on SBEs, importance of preventative screenings, current guidelines, high calcium diet, regular exercise, and multivitamin daily. Labs with PCP.   Osteopenia of multiple sites - 2018 T-score -1.9 FRAX 11% / 1.9%. Taking daily Vitamin D supplement. Recommend regular exercise and resistance training.   History of left breast cancer - 1985. BRCA negative  History of total vaginal hysterectomy (TVH) - 1985 with bilateral salpingectomies for menorrhagia. No HRT.   Postmenopausal - Plan: DG Bone Density  Menopausal vaginal dryness-- Using Yuvafem 3-4 times per month with good improvement.  We discussed stopping if she is able to tolerate.  Screening for cervical cancer - Normal Pap history.  No longer screening per guidelines.   Screening for breast cancer - 1985 left breast cancer. Continue annual screenings.  Normal breast exam today.  Screening for colon cancer - Colonoscopy 2021. Will repeat at GI's recommended interval.   Follow up in 2 years for CE.       Tamela Gammon [facility], 3:31 PM 12/30/2019

## 2019-12-30 NOTE — Patient Instructions (Signed)
Health Maintenance After Age 72 After age 72, you are at a higher risk for certain long-term diseases and infections as well as injuries from falls. Falls are a major cause of broken bones and head injuries in people who are older than age 72. Getting regular preventive care can help to keep you healthy and well. Preventive care includes getting regular testing and making lifestyle changes as recommended by your health care provider. Talk with your health care provider about:  Which screenings and tests you should have. A screening is a test that checks for a disease when you have no symptoms.  A diet and exercise plan that is right for you. What should I know about screenings and tests to prevent falls? Screening and testing are the best ways to find a health problem early. Early diagnosis and treatment give you the best chance of managing medical conditions that are common after age 72. Certain conditions and lifestyle choices may make you more likely to have a fall. Your health care provider may recommend:  Regular vision checks. Poor vision and conditions such as cataracts can make you more likely to have a fall. If you wear glasses, make sure to get your prescription updated if your vision changes.  Medicine review. Work with your health care provider to regularly review all of the medicines you are taking, including over-the-counter medicines. Ask your health care provider about any side effects that may make you more likely to have a fall. Tell your health care provider if any medicines that you take make you feel dizzy or sleepy.  Osteoporosis screening. Osteoporosis is a condition that causes the bones to get weaker. This can make the bones weak and cause them to break more easily.  Blood pressure screening. Blood pressure changes and medicines to control blood pressure can make you feel dizzy.  Strength and balance checks. Your health care provider may recommend certain tests to check your  strength and balance while standing, walking, or changing positions.  Foot health exam. Foot pain and numbness, as well as not wearing proper footwear, can make you more likely to have a fall.  Depression screening. You may be more likely to have a fall if you have a fear of falling, feel emotionally low, or feel unable to do activities that you used to do.  Alcohol use screening. Using too much alcohol can affect your balance and may make you more likely to have a fall. What actions can I take to lower my risk of falls? General instructions  Talk with your health care provider about your risks for falling. Tell your health care provider if: ? You fall. Be sure to tell your health care provider about all falls, even ones that seem minor. ? You feel dizzy, sleepy, or off-balance.  Take over-the-counter and prescription medicines only as told by your health care provider. These include any supplements.  Eat a healthy diet and maintain a healthy weight. A healthy diet includes low-fat dairy products, low-fat (lean) meats, and fiber from whole grains, beans, and lots of fruits and vegetables. Home safety  Remove any tripping hazards, such as rugs, cords, and clutter.  Install safety equipment such as grab bars in bathrooms and safety rails on stairs.  Keep rooms and walkways well-lit. Activity   Follow a regular exercise program to stay fit. This will help you maintain your balance. Ask your health care provider what types of exercise are appropriate for you.  If you need a cane or   walker, use it as recommended by your health care provider.  Wear supportive shoes that have nonskid soles. Lifestyle  Do not drink alcohol if your health care provider tells you not to drink.  If you drink alcohol, limit how much you have: ? 0-1 drink a day for women. ? 0-2 drinks a day for men.  Be aware of how much alcohol is in your drink. In the [place] one drink equals one typical bottle of beer (12  oz), one-half glass of wine (5 oz), or one shot of hard liquor (1 oz).  Do not use any products that contain nicotine or tobacco, such as cigarettes and e-cigarettes. If you need help quitting, ask your health care provider. Summary  Having a healthy lifestyle and getting preventive care can help to protect your health and wellness after age 72.  Screening and testing are the best way to find a health problem early and help you avoid having a fall. Early diagnosis and treatment give you the best chance for managing medical conditions that are more common for people who are older than age 72.  Falls are a major cause of broken bones and head injuries in people who are older than age 72. Take precautions to prevent a fall at home.  Work with your health care provider to learn what changes you can make to improve your health and wellness and to prevent falls. This information is not intended to replace advice given to you by your health care provider. Make sure you discuss any questions you have with your health care provider. Document Revised: 04/19/2018 Document Reviewed: 11/09/2016 Elsevier Patient Education  2020 [employer]vier Inc.  

## 2020-01-06 DIAGNOSIS — D510 Vitamin B12 deficiency anemia due to intrinsic factor deficiency: Secondary | ICD-10-CM | POA: Diagnosis not present

## 2020-02-10 DIAGNOSIS — Z1231 Encounter for screening mammogram for malignant neoplasm of breast: Secondary | ICD-10-CM | POA: Diagnosis not present

## 2020-02-11 DIAGNOSIS — D510 Vitamin B12 deficiency anemia due to intrinsic factor deficiency: Secondary | ICD-10-CM | POA: Diagnosis not present

## 2020-02-24 DIAGNOSIS — D519 Vitamin B12 deficiency anemia, unspecified: Secondary | ICD-10-CM | POA: Diagnosis not present

## 2020-02-26 ENCOUNTER — Telehealth: Payer: Self-pay | Admitting: *Deleted

## 2020-02-26 DIAGNOSIS — E348 Other specified endocrine disorders: Secondary | ICD-10-CM

## 2020-02-26 DIAGNOSIS — N952 Postmenopausal atrophic vaginitis: Secondary | ICD-10-CM

## 2020-02-26 MED ORDER — ESTRADIOL 10 MCG VA TABS: 10.0000 ug | ORAL_TABLET | VAGINAL | 3 refills | Status: AC

## 2020-02-26 MED ORDER — YUVAFEM 10 MCG VA TABS: 10.0000 ug | ORAL_TABLET | VAGINAL | 3 refills | Status: DC

## 2020-02-26 NOTE — Telephone Encounter (Signed)
Patient called requesting refill on Yuvafem 10 mcg tablet , discussed at annual exam on 12/30/19. Okay to send Rx?

## 2020-02-26 NOTE — Telephone Encounter (Signed)
[facility] pharmacist called back and said Rx was sent at [facility] he spoke with patient and she can take generic and prefers generic. Rx re-sent as generic.

## 2020-02-26 NOTE — Telephone Encounter (Signed)
Rx sent 

## 2020-02-26 NOTE — Telephone Encounter (Signed)
Yes that's fine. Thank you

## 2020-03-13 DIAGNOSIS — D510 Vitamin B12 deficiency anemia due to intrinsic factor deficiency: Secondary | ICD-10-CM | POA: Diagnosis not present

## 2020-04-13 DIAGNOSIS — D510 Vitamin B12 deficiency anemia due to intrinsic factor deficiency: Secondary | ICD-10-CM | POA: Diagnosis not present

## 2020-05-07 DIAGNOSIS — Z421 Encounter for breast reconstruction following mastectomy: Secondary | ICD-10-CM | POA: Diagnosis not present

## 2020-05-14 DIAGNOSIS — D510 Vitamin B12 deficiency anemia due to intrinsic factor deficiency: Secondary | ICD-10-CM | POA: Diagnosis not present

## 2020-05-18 DIAGNOSIS — F419 Anxiety disorder, unspecified: Secondary | ICD-10-CM | POA: Diagnosis not present

## 2020-05-18 DIAGNOSIS — F4001 Agoraphobia with panic disorder: Secondary | ICD-10-CM | POA: Diagnosis not present

## 2020-06-15 DIAGNOSIS — D510 Vitamin B12 deficiency anemia due to intrinsic factor deficiency: Secondary | ICD-10-CM | POA: Diagnosis not present

## 2020-06-19 DIAGNOSIS — E785 Hyperlipidemia, unspecified: Secondary | ICD-10-CM | POA: Diagnosis not present

## 2020-06-19 DIAGNOSIS — E559 Vitamin D deficiency, unspecified: Secondary | ICD-10-CM | POA: Diagnosis not present

## 2020-06-19 DIAGNOSIS — Z139 Encounter for screening, unspecified: Secondary | ICD-10-CM | POA: Diagnosis not present

## 2020-06-19 DIAGNOSIS — D510 Vitamin B12 deficiency anemia due to intrinsic factor deficiency: Secondary | ICD-10-CM | POA: Diagnosis not present

## 2020-06-19 DIAGNOSIS — R7301 Impaired fasting glucose: Secondary | ICD-10-CM | POA: Diagnosis not present

## 2020-06-19 DIAGNOSIS — F419 Anxiety disorder, unspecified: Secondary | ICD-10-CM | POA: Diagnosis not present

## 2020-06-19 DIAGNOSIS — I10 Essential (primary) hypertension: Secondary | ICD-10-CM | POA: Diagnosis not present

## 2020-07-03 DIAGNOSIS — D510 Vitamin B12 deficiency anemia due to intrinsic factor deficiency: Secondary | ICD-10-CM | POA: Diagnosis not present

## 2020-07-16 DIAGNOSIS — D510 Vitamin B12 deficiency anemia due to intrinsic factor deficiency: Secondary | ICD-10-CM | POA: Diagnosis not present

## 2020-08-17 DIAGNOSIS — D510 Vitamin B12 deficiency anemia due to intrinsic factor deficiency: Secondary | ICD-10-CM | POA: Diagnosis not present

## 2020-08-31 DIAGNOSIS — F419 Anxiety disorder, unspecified: Secondary | ICD-10-CM | POA: Diagnosis not present

## 2020-08-31 DIAGNOSIS — F429 Obsessive-compulsive disorder, unspecified: Secondary | ICD-10-CM | POA: Diagnosis not present

## 2020-08-31 DIAGNOSIS — R1013 Epigastric pain: Secondary | ICD-10-CM | POA: Diagnosis not present

## 2020-09-17 DIAGNOSIS — D510 Vitamin B12 deficiency anemia due to intrinsic factor deficiency: Secondary | ICD-10-CM | POA: Diagnosis not present

## 2020-09-23 DIAGNOSIS — Z1331 Encounter for screening for depression: Secondary | ICD-10-CM | POA: Diagnosis not present

## 2020-09-23 DIAGNOSIS — Z0000 Encounter for general adult medical examination without abnormal findings: Secondary | ICD-10-CM | POA: Diagnosis not present

## 2020-09-23 DIAGNOSIS — E785 Hyperlipidemia, unspecified: Secondary | ICD-10-CM | POA: Diagnosis not present

## 2020-09-23 DIAGNOSIS — Z9181 History of falling: Secondary | ICD-10-CM | POA: Diagnosis not present

## 2020-10-01 ENCOUNTER — Other Ambulatory Visit: Payer: Self-pay

## 2020-10-01 ENCOUNTER — Ambulatory Visit: Payer: Self-pay | Admitting: Adult Health

## 2020-10-12 DIAGNOSIS — D510 Vitamin B12 deficiency anemia due to intrinsic factor deficiency: Secondary | ICD-10-CM | POA: Diagnosis not present

## 2020-10-23 DIAGNOSIS — Z23 Encounter for immunization: Secondary | ICD-10-CM | POA: Diagnosis not present

## 2020-10-30 DIAGNOSIS — H04123 Dry eye syndrome of bilateral lacrimal glands: Secondary | ICD-10-CM | POA: Diagnosis not present

## 2020-11-03 ENCOUNTER — Ambulatory Visit (INDEPENDENT_AMBULATORY_CARE_PROVIDER_SITE_OTHER): Payer: Medicare Other | Admitting: Psychiatry

## 2020-11-03 DIAGNOSIS — F411 Generalized anxiety disorder: Secondary | ICD-10-CM

## 2020-11-03 NOTE — Progress Notes (Signed)
[facility] Counselor Initial Adult Exam  Name: Tracey Norris Date: 11/03/2020 MRN: [id number] DOB: September 24, 1945 PCP: Nicoletta Dress, MD  Time spent: 60 minutes  Guardian/Payee:  patient    Paperwork requested:  No   Reason for Visit /Presenting Problem:  anxiety, some depression  Mental Status Exam:    Appearance:   Well Groomed     Behavior:  Appropriate, Sharing, and Motivated  Motor:  Normal  Speech/Language:   Clear and Coherent  Affect:  anxious  Mood:  anxious and some depression  Thought process:  goal directed  Thought content:    Rumination and overthinking  Sensory/Perceptual disturbances:    WNL  Orientation:  oriented to person, place, time/date, situation, day of week, month of year, year, and stated date of November 03, 2020  Attention:  Fair  Concentration:  Fair  Memory:  Some short term memory concerns and states her Dr is aware  Fund of knowledge:   Good  Insight:    Good and Fair  Judgment:   Good  Impulse Control:  Fair   Reported Symptoms:  see symptoms above  Risk Assessment: Danger to Self:  No Self-injurious Behavior: No Danger to Others: No Duty to Warn:no Physical Aggression / Violence:No  Access to Firearms a concern: No  Gang Involvement:No  Patient / guardian was educated about steps to take if suicide or homicide risk level increases between visits: Denies any SI or HI. While future psychiatric events cannot be accurately predicted, the patient does not currently require acute inpatient psychiatric care and does not currently meet [facility] involuntary commitment criteria.  Substance Abuse History: Current substance abuse: No     Past Psychiatric History:   Previous psychological history is significant for anxiety Outpatient Providers: "Not sure but I saw a therapist and stopped after 4 times because it wasn't helping." History of Psych Hospitalization: No  Psychological Testing:  n/a    Abuse History: Victim of No.,   n/a    Report needed: No. Victim of Neglect:No. Perpetrator of  n/a   Witness / Exposure to Domestic Violence: No   Protective Services Involvement: No  Witness to [employer] Violence:  No   Family History: Reviewed with patient and she confirms info below.  Family History  Problem Relation Age of Onset   Diabetes Mother    Hypertension Mother    Breast cancer Mother        post-menopausal   Heart disease Mother    Heart disease Father    Heart failure Father    Stroke Father    Skin cancer Father        not melanoma, sun exposure   Hypertension Sister    Brain cancer Maternal Aunt        dx. 26's   Mesothelioma Paternal Uncle        aesbestos exposure   Other Maternal Grandmother        possibly female cancer- heavy vaginal bleeding before death   Gallbladder disease Maternal Grandmother     Living situation: the patient lives with their spouse in [place], [place]  Sexual Orientation:  Straight  Relationship Status: married for 4 yrs. Adult children: 1 son living in [place] and married with 2 kids, Adult daughter lives in [place] with spouse and 2 kids. 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 History of legal issue / charges:  n/a  Medical History/Surgical History:Reviewed with patient and she confirms info below. Past Medical History:  Diagnosis Date   Anxiety    Atrophic vaginitis    Breast cancer ([place])    Elevated cholesterol    Family history of brain cancer    Family history of breast cancer    GERD (gastroesophageal reflux disease)    HX: breast cancer 1985   left breast . non estrogen, no chemo or radiation   Hypertension    IBS (irritable bowel syndrome)    Lazy eye    Osteopenia 10/2016   T score -1.9 FRAX 11%/1.9%   Scalp itch    Sinus problem     Past Surgical History:  Procedure Laterality Date   BREAST SURGERY     left mastectomy with node resection, RECONSTRUCTION OF LEFT BREAST  IN 04/2005   BREAST SURGERY     reconstruction left and right 2006   EYE SURGERY     mastectomy surgery     left   TONSILLECTOMY     VAGINAL HYSTERECTOMY     TVH WITH OVARIAN CONSERVATION    Medications: Current Outpatient Medications  Medication Sig Dispense Refill   ALPRAZolam (XANAX) 0.5 MG tablet 2 (two) times daily as needed.  0   atenolol (TENORMIN) 25 MG tablet Take 25 mg by mouth daily.       Cholecalciferol (VITAMIN D3) 1000 UNITS CAPS Take 1,000 Units by mouth daily.      Cyanocobalamin (VITAMIN B-12 IJ) Inject as directed every 30 (thirty) days.     Estradiol (YUVAFEM) 10 MCG TABS vaginal tablet Place 1 tablet (10 mcg total) vaginally 2 (two) times a week. 24 tablet 3   fluconazole (DIFLUCAN) 150 MG tablet Take 1 tablet (150 mg total) by mouth once. (Patient not taking: Reported on 12/30/2019) 1 tablet 1   lansoprazole (PREVACID) 15 MG capsule Take 15 mg by mouth every other day.     nystatin-triamcinolone ointment (MYCOLOG) Apply 1 application topically 2 (two) times daily. (Patient not taking: Reported on 12/30/2019) 30 g 0   Probiotic Product (PROBIOTIC DAILY) CAPS Take 1 tablet by mouth daily.      simvastatin  (ZOCOR) 20 MG tablet Take 1 tablet by mouth daily.  2   No current facility-administered medications for this visit.    Allergies  Allergen Reactions   Levofloxacin Other (See Comments)    Muscle cramps and feet cramps    Diagnoses:    ICD-10-CM   1. Generalized anxiety disorder  F41.1     (Generalized anxiety disorder is given for today as her presenting diagnosis however I suspect there is more going on than just anxiety, and will continue to assess this and make any needed changes as we move forward.)   Treatment goal plan: Patient not signing treatment  plan on computer screen due to Covid.  Treatment goals: Treatment goals remain on treatment plan as patient works with strategies to achieve her goals.  Progress is assessed each session and documented in "progress" section of treatment note.  Long-term goal: Reduce overall level, frequency, and intensity of the anxiety so that daily functioning is not impaired.  Short-term goal: Increase understanding of beliefs and messages that produce the worry and anxiety.  Strategies: Explore cognitive messages that mediate anxiety response and retrain in adaptive cognitions.   Plan of Care:  Today is patient's first appointment with this therapist and we completed her initial evaluation and initial treatment goal plan.  Patient is a 75 year old who previously saw a therapist for 4 sessions and quit because "it wasn't helping." Her main symptom of concern is her anxiety "which I've had for as long as I can remember and my parents were anxious people." States she is close to family but doesn't really have many friends. Is on Xanax through her PCP and feels that helps, and has taken it approximately 25 yrs but "low doses of 1 tablet or 1/2 tablet." Presents as pleasant, very over-talkative, repeats herself several times, states "I'm anxious no matter where I am or who I'm with."  My anxiety sometimes leads to stomach issues. Hard to be away  from home. Enjoys good neighbors "but not really close friends." Feels supported by her husband. 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 Continues to talk almost non-stop during session, not manic-like, but seems to not feel listened to very much. "Butts heads a lot with husband but we love each other and he is supportive but tries to manage me and I don't like that."  Loves being around young children.  Patient presents today as well groomed in her appearance, behavior is appropriate and sharing, motor skills normal, speech and language is clear and coherent, affect is anxious, mood anxious, thought process is goal directed, thought content includes rumination and some overthinking, is well oriented to person/place/time/date/situation/day of week/month/year/and stated today's date of November 03, 2020.  Her attention and concentration are fair as her anxiety affects that, memory includes some short-term memory concerns and states that her doctor is aware of this, insight is good sometimes and fair on other occasions, judgment good impulse control fair.  Denies any thoughts to harm self or others and has no history of that.  Also denies any current substance abuse nor history.  Patient's extended family includes an adult son living in [place] who is married with 2 kids, and an adult daughter who lives in [place] with her spouse and 2 kids.  She reports not having many friends and some of that is related to her anxiety and perhaps her difficulty having a good friendship because of anxiety symptoms including the over talking which makes it more difficult for her to listen in conversations.  Patient states that her adult children are her best friends.  Reports her strengths as including her family who provide her with supportive relationships, a sense of hopefulness, and being a good self advocate and able to communicate effectively per patient report.   *Patient  states goal areas as being: I want to be able to get out and go places without being to overly anxious. I would like to travel again without excessive anxiety. I don't want anxiety to handicap me and understand my anxiety better.  (Treatment goal plan is in section above of  this initial evaluation.  *For more information on this patient including medical history, please see above sections of this initial evaluation.  Goal review with patient and she is in agreement with  treatment goals.  Next appt within 3 weeks.  This record has been created using [employer].  Chart creation errors have been sought, but may not always have been located and corrected.  Such creation errors do not reflect on the standard of medical care provided.    Shanon Ace, LCSW

## 2020-11-13 DIAGNOSIS — M79601 Pain in right arm: Secondary | ICD-10-CM | POA: Diagnosis not present

## 2020-11-13 DIAGNOSIS — M79602 Pain in left arm: Secondary | ICD-10-CM | POA: Diagnosis not present

## 2020-11-13 DIAGNOSIS — D510 Vitamin B12 deficiency anemia due to intrinsic factor deficiency: Secondary | ICD-10-CM | POA: Diagnosis not present

## 2020-12-07 DIAGNOSIS — K582 Mixed irritable bowel syndrome: Secondary | ICD-10-CM | POA: Diagnosis not present

## 2020-12-07 DIAGNOSIS — K219 Gastro-esophageal reflux disease without esophagitis: Secondary | ICD-10-CM | POA: Diagnosis not present

## 2020-12-11 DIAGNOSIS — D519 Vitamin B12 deficiency anemia, unspecified: Secondary | ICD-10-CM | POA: Diagnosis not present

## 2020-12-11 DIAGNOSIS — R5382 Chronic fatigue, unspecified: Secondary | ICD-10-CM | POA: Diagnosis not present

## 2020-12-14 DIAGNOSIS — D510 Vitamin B12 deficiency anemia due to intrinsic factor deficiency: Secondary | ICD-10-CM | POA: Diagnosis not present

## 2020-12-17 ENCOUNTER — Other Ambulatory Visit: Payer: Self-pay

## 2020-12-17 ENCOUNTER — Ambulatory Visit (INDEPENDENT_AMBULATORY_CARE_PROVIDER_SITE_OTHER): Payer: Medicare Other | Admitting: Psychiatry

## 2020-12-17 DIAGNOSIS — F411 Generalized anxiety disorder: Secondary | ICD-10-CM

## 2020-12-17 NOTE — Progress Notes (Signed)
[facility] Counselor/Therapist Progress Note  Patient ID: Tracey Norris, MRN: [id number],    Date: 12/17/2020  Time Spent: 55 minutes   Treatment Type: Individual Therapy  Reported Symptoms: anxious, some depression "at times"  Mental Status Exam:  Appearance:   Well Groomed     Behavior:  Appropriate, Sharing, and Motivated  Motor:  Normal  Speech/Language:   Clear and Coherent  Affect:  anxious  Mood:  anxious  Thought process:  Some tangentiality  Thought content:    Obsessions and Rumination  Sensory/Perceptual disturbances:    WNL  Orientation:  oriented to person, place, time/date, situation, day of week, month of year, year, and stated date of Dec. 8, 2022  Attention:  Good  Concentration:  Good and Fair  Memory:  "Some forgetfulness,  short term memory"  Fund of knowledge:   Good and Fair  Insight:    Fair  Judgment:   Good and Fair  Impulse Control:  Fair   Risk Assessment: Danger to Self:  No Self-injurious Behavior: No Danger to Others: No Duty to Warn:no Physical Aggression / Violence:No  Access to Firearms a concern: No  Gang Involvement:No   Subjective:    Patient in today reporting anxiety, some occasional depression, "I worry all the time and always have". Forgetful "at times".  Resistance to being able to change or trying strategies which we worked on in session today. "Scared of Alzheimers and don't want to have it." States her PCP "doesn't feel that I have alzheimers." Anxiously speaking almost non-stop. Tried to encourage her to slow her talking down and actually breathe some. Hard for her to "not get keyed up and hard to calm down". Takes Xanax which helps some. Goes off on tangents frequently. Hard to believe she can change.  Worked on the beliefs that she has about herself especially that she "may not be able to change".  A significant concern with patient is how long she has worried which she states "as long as I can remember and my parents  were also anxious people".  Some ruminating and overthinking.  It is also very difficult to inject some helpful suggestions/strategies to patient as she does tend to speak almost nonstop.  I feel like we did have a little more meaningful exchange of conversation today in session and she has some homework and working on some deliberate interruption of talking and being able to have some "quiet time" 3 times a day.  Also to work on some deep breathing exercises daily, and set some limits as discussed with the contact she is having with her older sister that is exacerbating patient's situation.  States that her adult children are her best friends and that it is been hard to keep friends.  Some of her communication style with excessive talking and not much listening she admits may contribute to that.  Not feeling as hopeless today.  Worries that her anxiety is a sign of her getting "Alzheimer's" and asked me several times about this today, which I deferred to her regular medical doctor who has discussed that issue with her several occasions and does not feel that is the case currently.  States that she "does want to eventually be able to get out more and go places without being so anxious, travel some without excessive anxiety, and she does not want anxiety to handicap her ".  Interventions: Solution-Oriented/Positive Psychology, Ego-Supportive, and Insight-Oriented  Treatment goal plan: Patient not signing treatment plan on computer screen  due to Covid. Treatment goals: Treatment goals remain on treatment plan as patient works with strategies to achieve her goals.  Progress is assessed each session and documented in "progress" section of treatment note.  Long-term goal: Reduce overall level, frequency, and intensity of the anxiety so that daily functioning is not impaired. Short-term goal: Increase understanding of beliefs and messages that produce the worry and anxiety. Strategies: Explore cognitive  messages that mediate anxiety response and retrain in adaptive cognitions.     Diagnosis:   ICD-10-CM   1. Generalized anxiety disorder  F41.1      Plan: Patient today showing motivation and participation in session today although very hard for her to stop talking as most things spark her anxiety and she feels she "must talk about it".  Husband reported after session as they were leaving our office that this is what "I deal with daily".  He does seem to be a supportive resource for patient and is trying to help her in any way possible.  It does seem that some of the pandemic has accentuated her anxiety but as she said she has been anxious for as long as she can remember" including in her childhood and that her parents also had some anxiety but states theirs was not as bad".  Did a little better towards the end of session and staying on topic for period of time but we were able to review some strategies and I was able to encourage her and support her having more hope in herself and her ability to make changes.  Also encouraged patient to try to: Stay in the present focusing on what she can change, work on the moments of silence occasionally throughout the day, get outside some daily and walk, stay in touch with people who are supportive, intentionally looking for more positives than negatives each day, stop assuming worst case scenarios, healthy nutrition and exercise, remain on her prescribed medication, practicing consistent positive self talk, believe in herself and her ability to make changes, consider using some helpful books or podcasts/daily affirmations, practice more intentional listening to others, reduce overthinking and over analyzing, challenge and counteract her self doubt, practice interrupting negative/anxious thoughts and challenging them with the hope that she can eventually replace them with more reality-based thoughts, letting go of things including guilt that hold her back, continue to  work on her control and perfectionistic issues, and see the strength that she shows when she works with goal-directed behaviors to move in a direction that supports improved emotional health.  Goal review and progress/challenges noted with patient.  Next appointment within 2 to 3 weeks.  This record has been created using [employer].  Chart creation errors have been sought, but may not always have been located and corrected.  Such creation errors do not reflect on the standard of medical care provided.    Shanon Ace, LCSW

## 2021-01-07 ENCOUNTER — Ambulatory Visit (INDEPENDENT_AMBULATORY_CARE_PROVIDER_SITE_OTHER): Payer: Medicare Other | Admitting: Psychiatry

## 2021-01-07 ENCOUNTER — Other Ambulatory Visit: Payer: Self-pay

## 2021-01-07 DIAGNOSIS — F411 Generalized anxiety disorder: Secondary | ICD-10-CM | POA: Diagnosis not present

## 2021-01-07 NOTE — Progress Notes (Signed)
[facility] Counselor/Therapist Progress Note  Patient ID: Tracey Norris, MRN: [id number],    Date: 01/07/2021  Time Spent: 55 minutes   Treatment Type: Individual Therapy  Reported Symptoms: anxiety, some depression "sometimes comes and goes", difficulty staying on topic  Mental Status Exam:  Appearance:   Casual     Behavior:  Appropriate, Sharing, and Motivated  Motor:  Normal  Speech/Language:   Clear and Coherent  Affect:  anxious  Mood:  anxious  Thought process:  Some tangentiality  Thought content:    Some obsessiveness, ruminating  Sensory/Perceptual disturbances:    WNL  Orientation:  oriented to person, place, time/date, situation, day of week, month of year, year, and stated date of Dec. 29, 2022  Attention:  Fair  Concentration:  Fair  Memory:  Reports some recent memory issues and her Dr. Is aware  Fund of knowledge:   Good  Insight:    Good and Fair  Judgment:   Good and Fair  Impulse Control:  Good and Fair   Risk Assessment: Danger to Self:  No Self-injurious Behavior: No Danger to Others: No Duty to Warn:no Physical Aggression / Violence:No  Access to Firearms a concern: No  Gang Involvement:No   Subjective: Patient in today reporting anxiety and some depression. "I worry all the time." "And it's worse when things happen unexpectedly, or when I'm around others, and actually it can happen most any time and does happen often." Talks almost nonstop and very difficult to stay on task or focused in conversation.  Forgetful at times and "have told my doctor." States again that her "doctor says I don't have alzheimers."  Hard to try strategies to decrease her anxiety because "of the thoughts going on in my head, anxious thoughts always." Worked in session with her and it continued to be difficult for her to stop talking at any point and remain stopped; would begin talking again and then get frustrated at herself, saying I've been this way a long time  maybe 40 years". Focused today on not assuming worst case scenarios, and stopping some as she speaks and being able to answer a question and not "ramble on afterwards." Does seem to be motivated.  She does feel that these are areas she needs to target for change but does express some concern about her ability to change after 40 years of repeating similar behaviors.  We discussed the fact that yes she can indeed make changes, it may be more challenging at times, but she can make changes for the positive.  Is focusing on 2 main areas right now including stop talking after responding to somebody without elaborating and going on and on, and stop assuming worst case scenarios and practice looking for positives versus negatives.  These are 2 areas that patient feels she needs to work on the most and may be the most difficult for her to change, and she is in agreement with this being the focus currently, and is sharing with husband who is a good support for her and trying to make changes.  Interventions: Solution-Oriented/Positive Psychology, Ego-Supportive, and Insight-Oriented  Treatment goal plan: Patient not signing treatment plan on computer screen due to Covid. Treatment goals: Treatment goals remain on treatment plan as patient works with strategies to achieve her goals.  Progress is assessed each session and documented in "progress" section of treatment note.  Long-term goal: Reduce overall level, frequency, and intensity of the anxiety so that daily functioning is not impaired. Short-term  goal: Increase understanding of beliefs and messages that produce the worry and anxiety. Strategies: Explore cognitive messages that mediate anxiety response and retrain in adaptive cognitions.   Diagnosis:   ICD-10-CM   1. Generalized anxiety disorder  F41.1      Plan: Patient today showing good motivation and active engagement in session today as she worked more on her anxiety and depression, with anxiety  being the more prominent symptom and area of focus today.  As noted above, it is very difficult for patient to pause her talking in conversations in order to listen to others and she states that "I have things that pop up in my mind and make me anxious and I must talk about it at the moment".  This is a behavior that she admits has gone on approximately 40 years but impacts her family relationships and marriage, although has been is remaining supportive especially now that she is in treatment and he can help support that and patient seems willing for that to happen.  Patient did show good effort today and working on some strategies to help her better focus in conversations rather than just starting to talk nonstop.  We worked with some specific examples in session today and with some coaching by therapist, she was able to have some success which she seemed to feel good about but also noted that it may be hard to hold onto because this is very different f behavior for her.  Discussed her getting an updated medication evaluation and patient is in agreement with this and was able to get scheduled with med provider within 3 weeks.  Encouraged patient again this session to: Stay in the present focusing on what she can change, work on the moments of silence occasionally throughout the day, get outside some daily and walk, stay in touch with people who are supportive, intentionally look for more positives than negatives daily, stop assuming worst case scenarios, healthy nutrition and exercise, remain on her prescribed medication until her upcoming reevaluation for meds, practicing consistent positive self talk, believe in herself and her ability to make tough changes, consider using some helpful books or podcasts/daily affirmations, practice more intentional listening to others, reduce overthinking and over analyzing, challenge and counteract her self doubt, practice interrupting negative/anxious thoughts and challenging  them with the hope that she can eventually replace them with more reality-based thoughts, letting go of things including guilt that hold her back, continue to work on her control and perfectionistic issues, and realize the strength that she shows when she works with goal-directed behaviors to move in a direction that supports improved emotional health.  Goal review and progress/challenges noted with patient.  Next appointment within 3 weeks  This record has been created using [employer].  Chart creation errors have been sought, but may not always have been located and corrected.  Such creation errors do not reflect on the standard of medical care provided.   Shanon Ace, LCSW

## 2021-01-15 DIAGNOSIS — D510 Vitamin B12 deficiency anemia due to intrinsic factor deficiency: Secondary | ICD-10-CM | POA: Diagnosis not present

## 2021-01-25 ENCOUNTER — Encounter: Payer: Self-pay | Admitting: Adult Health

## 2021-01-25 ENCOUNTER — Other Ambulatory Visit: Payer: Self-pay

## 2021-01-25 ENCOUNTER — Ambulatory Visit (INDEPENDENT_AMBULATORY_CARE_PROVIDER_SITE_OTHER): Payer: Medicare Other | Admitting: Adult Health

## 2021-01-25 VITALS — Wt 122.0 lb

## 2021-01-25 DIAGNOSIS — F411 Generalized anxiety disorder: Secondary | ICD-10-CM

## 2021-01-25 DIAGNOSIS — Z01419 Encounter for gynecological examination (general) (routine) without abnormal findings: Secondary | ICD-10-CM

## 2021-01-25 MED ORDER — ESCITALOPRAM OXALATE 5 MG PO TABS: 5.0000 mg | ORAL_TABLET | Freq: Every day | ORAL | 5 refills | Status: AC

## 2021-01-25 NOTE — Progress Notes (Signed)
[facility] MD/PA/NP Initial Note  01/25/2021 1:54 PM Tracey Norris  MRN:  [id number]  Chief Complaint:   HPI:  Referred by therapist - Rinaldo Cloud  Describes mood today as "ok". Pleasant. Reports tearfulness. Mood symptoms - reports depression, anxiety, and irritability. Reports worry and rumination. Mood is consistent - denies highs and lows. Has tried three different medications to help with symptoms and has not tolerated any of them. Has started seeing a therapist - 3 visits.  Stable interest and motivation. Taking medications as prescribed.  Energy levels stable. Active, does not have a regular exercise routine.   Enjoys some usual interests and activities. Married. Lives with husband of 91 years. Has 2 children 51 and 69 - lives local. Spending time with family. Appetite adequate. Weight stable - 122 pounds. Sleeps well most nights. Averages 8 or more hours. Reports some daytime napping. Focus and concentration stable - most of the time. Completing tasks. Managing aspects of household. Retired. Denies SI or HI.  Denies AH or VH.  Previous medication trials:  Xanax, Paxil, Cymbalta, Effexor  Visit Diagnosis:    ICD-10-CM   1. Generalized anxiety disorder  F41.1 escitalopram (LEXAPRO) 5 MG tablet    2. Well female exam with routine gynecological exam  Z01.419       Past Psychiatric History: Denies psychiatric hospitalization.   Past Medical History:  Past Medical History:  Diagnosis Date   Anxiety    Atrophic vaginitis    Breast cancer ([place])    Elevated cholesterol    Family history of brain cancer    Family history of breast cancer    GERD (gastroesophageal reflux disease)    HX: breast cancer 1985   left breast . non estrogen, no chemo or radiation   Hypertension    IBS (irritable bowel syndrome)    Lazy eye    Osteopenia 10/2016   T score -1.9 FRAX 11%/1.9%   Scalp itch    Sinus problem     Past Surgical History:  Procedure Laterality Date   BREAST  SURGERY     left mastectomy with node resection, RECONSTRUCTION OF LEFT BREAST  IN 04/2005   BREAST SURGERY     reconstruction left and right 2006   EYE SURGERY     mastectomy surgery     left   TONSILLECTOMY     VAGINAL HYSTERECTOMY     TVH WITH OVARIAN CONSERVATION    Family Psychiatric History: Family history of mental illness - father bipolar.  Family History:  Family History  Problem Relation Age of Onset   Diabetes Mother    Hypertension Mother    Breast cancer Mother        post-menopausal   Heart disease Mother    Heart disease Father    Heart failure Father    Stroke Father    Skin cancer Father        not melanoma, sun exposure   Hypertension Sister    Brain cancer Maternal Aunt        dx. 51's   Mesothelioma Paternal Uncle        aesbestos exposure   Other Maternal Grandmother        possibly female cancer- heavy vaginal bleeding before death   Gallbladder disease Maternal Grandmother     Social History:  Social History   Socioeconomic History   Marital status: Married    Spouse name: Not on file   Number of children: Not on file   Years of education:  Not on file   Highest education level: Not on file  Occupational History   Not on file  Tobacco Use   Smoking status: Never   Smokeless tobacco: Never  Vaping Use   Vaping Use: Never used  Substance and Sexual Activity   Alcohol use: No   Drug use: No   Sexual activity: Yes    Birth control/protection: Surgical    Comment: 1st intercourse 76 yo-Fewer than 5 partners  Other Topics Concern   Not on file  Social History Narrative   Not on file   Social Determinants of Health   Financial Resource Strain: Not on file  Food Insecurity: Not on file  Transportation Needs: Not on file  Physical Activity: Not on file  Stress: Not on file  Social Connections: Not on file    Allergies:  Allergies  Allergen Reactions   Levofloxacin Other (See Comments)    Muscle cramps and feet cramps    Prednisone     Other reaction(s): Other (See Comments) Pt. Reports it "altered her blood sugar and affected GI system"    Metabolic Disorder Labs: No results found for: HGBA1C, MPG No results found for: PROLACTIN No results found for: CHOL, TRIG, HDL, CHOLHDL, VLDL, LDLCALC No results found for: TSH  Therapeutic Level Labs: No results found for: LITHIUM No results found for: VALPROATE No components found for:  CBMZ  Current Medications: Current Outpatient Medications  Medication Sig Dispense Refill   escitalopram (LEXAPRO) 5 MG tablet Take 1 tablet (5 mg total) by mouth daily. 30 tablet 5   ALPRAZolam (XANAX) 0.5 MG tablet 2 (two) times daily as needed.  0   atenolol (TENORMIN) 25 MG tablet Take 25 mg by mouth daily.       Cholecalciferol (VITAMIN D3) 1000 UNITS CAPS Take 1,000 Units by mouth daily.      Cyanocobalamin (VITAMIN B-12 IJ) Inject as directed every 30 (thirty) days.     Estradiol (YUVAFEM) 10 MCG TABS vaginal tablet Place 1 tablet (10 mcg total) vaginally 2 (two) times a week. 24 tablet 3   lansoprazole (PREVACID) 15 MG capsule Take 15 mg by mouth every other day.     Probiotic Product (PROBIOTIC DAILY) CAPS Take 1 tablet by mouth daily.      simvastatin (ZOCOR) 20 MG tablet Take 1 tablet by mouth daily.  2   No current facility-administered medications for this visit.    Medication Side Effects: none  Orders placed this visit:  No orders of the defined types were placed in this encounter.   Psychiatric Specialty Exam:  Review of Systems  Musculoskeletal:  Negative for gait problem.  Neurological:  Negative for tremors.  Psychiatric/Behavioral:         Please refer to HPI   There were no vitals taken for this visit.There is no height or weight on file to calculate BMI.  General Appearance: Casual and Neat  Eye Contact:  Good  Speech:  Clear and Coherent and Normal Rate  Volume:  Normal  Mood:  Anxious  Affect:  Appropriate and Congruent  Thought  Process:  Coherent and Descriptions of Associations: Intact  Orientation:  Full (Time, Place, and Person)  Thought Content: Logical   Suicidal Thoughts:  No  Homicidal Thoughts:  No  Memory:  WNL  Judgement:  Good  Insight:  Good  Psychomotor Activity:  Normal  Concentration:  Concentration: Good  Recall:  Good  Fund of Knowledge: Good  Language: Good  Assets:  Communication Skills  Desire for Improvement Financial Resources/Insurance Housing Intimacy Leisure Time Physical Health Resilience Social Support Talents/Skills Transportation Vocational/Educational  ADL's:  Intact  Cognition: WNL  Prognosis:  Good   Screenings: MDQ  Receiving Psychotherapy: Yes - Rinaldo Cloud  Treatment Plan/Recommendations:  Plan:  PDMP reviewed  Add Lexapro 5mg    Time spent with patient was 60 minutes. Greater than 50% of face to face time with patient was spent on counseling and coordination of care. We discussed     RTC 4 weeks   Patient advised to contact office with any questions, adverse effects, or acute worsening in signs and symptoms.   Aloha Gell, NP

## 2021-01-28 ENCOUNTER — Ambulatory Visit (INDEPENDENT_AMBULATORY_CARE_PROVIDER_SITE_OTHER): Payer: Medicare Other | Admitting: Psychiatry

## 2021-01-28 ENCOUNTER — Other Ambulatory Visit: Payer: Self-pay

## 2021-01-28 DIAGNOSIS — F411 Generalized anxiety disorder: Secondary | ICD-10-CM | POA: Diagnosis not present

## 2021-01-28 NOTE — Progress Notes (Signed)
[facility] Counselor/Therapist Progress Note  Patient ID: Tracey Norris, MRN: [id number],    Date: 01/28/2021  Time Spent: 55 minutes   Treatment Type: Individual Therapy  Reported Symptoms: anxiety,depression  Mental Status Exam:  Appearance:   Neat     Behavior:  Appropriate, Sharing, and Motivated  Motor:  Normal  Speech/Language:   Clear and Coherent and talks almost non-stop but not "manic"  Affect:  anxious  Mood:  anxious  Thought process:  goal directed  Thought content:    Obsessions  Sensory/Perceptual disturbances:    WNL  Orientation:  oriented to person, place, time/date, situation, day of week, month of year, year, and stated date of Jan. 19, 2023  Attention:  Fair  Concentration:  Fair  Memory:  Has had some short term memory concerns "but I think I've been better more recently"  Fund of knowledge:   Good  Insight:    Good and Fair  Judgment:   Good  Impulse Control:  Good and Fair   Risk Assessment: Danger to Self:  No Self-injurious Behavior: No Danger to Others: No Duty to Warn:no Physical Aggression / Violence:No  Access to Firearms a concern: No  Gang Involvement:No   Subjective: Patient in today reporting anxiety and some depression "but more anxiety and feel nervous often because of my anxiety."  States her anxiety "has been a little better recently but we haven't gone very many places; stayed home more."  States she is working to decrease her worrying and husband helps her with this at home "but is sometimes impatient with me." Sister is improving some with her health issues and "that helps me with my worrying about her."  Focused today on her excessive talking, her anxiety, and depression.  States she has always been and excessive talker and it does annoy some people, but husband is helping her with this as "I'm not always aware when I'm being  obsessive and excessive in my thoughts and in my talking." Trying to stay on topic more in  conversation and focusing tasks more effectively. Some forgetting "but it's when I get nervous" and my doctor is aware and says I don't have alzheimers." Starting Lexapro per her med provider.  Didn't start it last night but commits to taking it tonight. Did work some on her exercises between session to reduce her anxious/constant talking (with the supportive help of husband.) Also worked in session today with some similar exercises and patient responded well and actually caught herself 1 time and was able to stop her obsessiveness. Very difficult for patient per her report but is motivated to continue working on her goals. Often says "I've been this way so long, I don't know if I can change, wish I just had a pill that would fix it."  Encouraged to persevere and keep practicing strategies and letting her husband assist as his observations and feedback have been helpful.  Seems to be more motivated today.  Still does doubt herself however because she says she has not changed in 40 years and not sure she can now.  To continue working on her 2 main areas that she agreed upon last session including: Stop talking after responding to someone without a lot of elaborating on and on.  The second area that she is working on is to stop assuming worst case scenarios and practice looking for positives versus negatives.  These are the areas that patient (and husband) have felt she needed to work on the  most and that affects her negatively in relationships.  Patient also indicates she feels it is can be the most difficult for her to change but she is in agreement with this being the focus currently currently  Interventions: Solution-Oriented/Positive Psychology, Ego-Supportive, and Insight-Oriented   Treatment goal plan: Patient not signing treatment plan on computer screen due to Covid. Treatment goals: Treatment goals remain on treatment plan as patient works with strategies to achieve her goals.  Progress is assessed  each session and documented in "progress" section of treatment note.  Long-term goal: Reduce overall level, frequency, and intensity of the anxiety so that daily functioning is not impaired. Short-term goal: Increase understanding of beliefs and messages that produce the worry and anxiety. Strategies: Explore cognitive messages that mediate anxiety response and retrain in adaptive cognitions.  Diagnosis:   ICD-10-CM   1. Generalized anxiety disorder  F41.1       Plan:  Patient today showing good motivation and participated well in session as we worked on her anxiety and excessive worrying and over talking, as noted in "subject" section above.  She has done well this past couple weeks in working on the behaviors targeted which are her over talking and over elaborating in conversations as well as always looking for what might go wrong and worst case scenarios.  Patient has stated that she has done this for 40 years and does doubt her ability to change, but wants to try and change.  Does have a very understanding and supportive husband who has been helpful thus far.  The problem with patient's over talking and always looking for worst case scenarios is it does affect her in relationships but also it increases her anxiety a lot and she does sense that on a daily basis.  Her motivation seems a little better today and she actually caught herself once in session doing the over talking and over elaborating and assuming worst case scenarios, which I thought was encouraging and commented on this to her.  She will continue to work with this in between sessions and follow up next visit.  Encouraged patient in her trying to have more positive behaviors including: Staying in the present focusing on what she can change, reflecting on any progress daily, work on having the moments of silence occasionally throughout the day, stay in touch with people who are supportive, get outside some daily and walk as she is able,  intentionally look for more positives versus negatives daily, stop assuming worst case scenarios, healthy nutrition and exercise, remain on her prescribed medication, practice consistent positive self talk, believe in herself and her ability to make tough changes, consider using some helpful books or podcasts/daily affirmations, practice more intentional listening to others, reduce overthinking and over analyzing, challenge and counteract self doubt, practice interrupting negative/anxious thoughts and challenge them with the hope that she can eventually replace them with more reality-based thoughts, letting go of things including guilt that hold her back, continue her work to decrease the perfectionistic issues, and see the strength that she shows when she works with goal-directed behaviors to move in a direction that supports improved emotional health.   Goal review and progress/challenges noted with patient.  Next appointment within 2 to 3 weeks.  This record has been created using [employer].  Chart creation errors have been sought, but may not always have been located and corrected.  Such creation errors do not reflect on the standard of medical care provided.  Shanon Ace, LCSW

## 2021-02-01 ENCOUNTER — Telehealth: Payer: Self-pay | Admitting: Adult Health

## 2021-02-01 NOTE — Telephone Encounter (Signed)
[phone] Pt called and needs to go over med side effects of Lexapro 5mg , 1/2 tablet only. She had bad burning in her stomach once she took Lexapro. She took it with dinner. She only took it once.She does have some GI issues/acid reflux. Doesn't think she can keep taking it.

## 2021-02-02 NOTE — Telephone Encounter (Signed)
Patient notified. She had an appt with Gina for 2/13 to F/U on the Lexapro and wanted to cancel it since she was not going to take it right now. Message sent to ADM to cancel appt.

## 2021-02-02 NOTE — Telephone Encounter (Signed)
Noted  

## 2021-02-02 NOTE — Telephone Encounter (Signed)
See phone message. Called patient and she said she has GI issues chronically, but seems to be having a flair. She said the Lexapro seemed to "set her on fire." She said she thinks it might be better to wait until her GI issues calm down and then try the Lexapro again.

## 2021-02-11 ENCOUNTER — Ambulatory Visit (INDEPENDENT_AMBULATORY_CARE_PROVIDER_SITE_OTHER): Payer: Medicare Other | Admitting: Psychiatry

## 2021-02-11 ENCOUNTER — Other Ambulatory Visit: Payer: Self-pay

## 2021-02-11 DIAGNOSIS — F411 Generalized anxiety disorder: Secondary | ICD-10-CM | POA: Diagnosis not present

## 2021-02-11 NOTE — Progress Notes (Signed)
[facility] Counselor/Therapist Progress Note  Patient ID: Tracey Norris, MRN: [id number],    Date: 02/11/2021  Time Spent: 50 minutes   Treatment Type: Individual Therapy  Reported Symptoms: anxiety, some depression,overthinking and overtalking in communicating with others  Mental Status Exam:  Appearance:   Casual     Behavior:  Appropriate, Sharing, and Motivated  Motor:  Normal  Speech/Language:   Clear and Coherent  Affect:  Depressed and anxious  Mood:  anxious and depressed  Thought process:  Some tangentiality  Thought content:    Obsessions and overthinking  Sensory/Perceptual disturbances:    WNL  Orientation:  oriented to person, place, time/date, situation, day of week, month of year, year, and stated date of Feb. 2, 2023  Attention:  Good  Concentration:  Good  Memory:  Worries some about my short term memory and Dr. Is aware  Fund of knowledge:   Good  Insight:    Good and Fair  Judgment:   Good  Impulse Control:  Fair   Risk Assessment: Danger to Self:  NO Self-injurious Behavior: No Danger to Others: No Duty to Warn:no Physical Aggression / Violence:No  Access to Firearms a concern: No  Gang Involvement:No   Subjective:  In today reporting anxiety, depression, overthinking, overtalking in communicating with others. Difficulty setting limits in her talking, which affect relationships at home and beyond. Worked today on each of these and patient reports some improvement in trying not to overthink nor overtalk with others in conversations and practicing listening more. It was noticeable today that she is working with her goals and making some progress. Husband confirmed her improvement after session with patient was over. "Nervous, anxious and my anxiety sometimes is because I'm so nervous, but not as bad the past couple weeks." Older sister has stabilized medically "but not always with it mentally."Patient talked more today about her excessive talking  and how hard it is to be quiet at times. Some understanding how this positively affects her relationships. Acknowledges that her constant overtalking does annoy some people and husband is helping her with this "because I'm not always aware when I'm being obsessive in my thoughts and I know it annoys a lot of people." Patient showing some increase in insight and motivation. Discussed strategies for her to keep practicing between sessions and letting her husband assist with observing and providing helpful feedback.  States she knows she needs to get out of the house more and this is going to be a goal for her in between sessions "to really focus on this more".  Also to continue working on practicing listening more in relationships and conversations versus excessively talking.  In doing this a little bit more since last session, patient admits she sees a difference and knows that she needs to be making this change in order to improve her relationships.  To also continue working on decreasing her assuming worst case scenarios, which always accelerates her anxiety.  Interventions: Solution-Oriented/Positive Psychology, Ego-Supportive, and Insight-Oriented  Treatment goal plan: Patient not signing treatment plan on computer screen due to Covid. Treatment goals: Treatment goals remain on treatment plan as patient works with strategies to achieve her goals.  Progress is assessed each session and documented in "progress" section of treatment note.  Long-term goal: Reduce overall level, frequency, and intensity of the anxiety so that daily functioning is not impaired. Short-term goal: Increase understanding of beliefs and messages that produce the worry and anxiety. Strategies: Explore cognitive messages that mediate  anxiety response and retrain in adaptive cognitions.  Diagnosis:   ICD-10-CM   1. Generalized anxiety disorder  F41.1      Plan:   Patient today showing some progress and working on  goal-directed behaviors in her work to try and decrease her anxiety and depression, which has been per her report aggravated from her chronic over talking, overthinking, overly assuming worst case scenarios, negatively impacting her relationships by not listening, and sometimes staying cooped up in the house.  Did well in processing some of her progress in session today and also her need areas.  Her attitude seems more positive as she talks about her goals than when she first started treatment.  Good motivation today and participated actively in session.  Encouraged patient in her practice of more positive behaviors including: Noticing any improvement and reflecting on it often, staying in the present focusing on what she can change, work on having the moments of silence occasionally throughout the day, stay in touch with people who are supportive, get outside some daily and walk as she is able, intentionally look for more positives versus negatives daily, stop assuming worst case scenarios, healthy nutrition and exercise, remain on her prescribed medication, practice consistent positive self talk, believe in herself and her ability to make tough changes, consider using some helpful books or podcasts/daily affirmations, practice more intentional listening to others, reduce overthinking and over analyzing, challenge and counteract self doubt, practice interrupting negative/anxious/fearful thoughts and challenge them with the hope that she can eventually replace them with more reality-based thoughts, letting go of things including guilt that hold her back, continue her work to decrease the perfectionistic tendencies, and realize the strength that she shows when she works with goal-directed behaviors to move in a direction that supports improved emotional health.  Review and progress/challenges noted with patient.  Next appointment within 2 to 3 weeks.  This record has been created using [employer].  Chart  creation errors have been sought, but may not always have been located and corrected.  Such creation errors do not reflect on the standard of medical care provided.   Shanon Ace, LCSW

## 2021-02-16 DIAGNOSIS — D510 Vitamin B12 deficiency anemia due to intrinsic factor deficiency: Secondary | ICD-10-CM | POA: Diagnosis not present

## 2021-02-18 ENCOUNTER — Other Ambulatory Visit: Payer: Self-pay | Admitting: Adult Health

## 2021-02-18 DIAGNOSIS — F411 Generalized anxiety disorder: Secondary | ICD-10-CM

## 2021-02-22 ENCOUNTER — Ambulatory Visit: Payer: Medicare Other | Admitting: Adult Health

## 2021-02-24 DIAGNOSIS — K130 Diseases of lips: Secondary | ICD-10-CM | POA: Diagnosis not present

## 2021-02-25 ENCOUNTER — Other Ambulatory Visit: Payer: Self-pay

## 2021-02-25 ENCOUNTER — Ambulatory Visit (INDEPENDENT_AMBULATORY_CARE_PROVIDER_SITE_OTHER): Payer: Medicare Other | Admitting: Psychiatry

## 2021-02-25 DIAGNOSIS — F411 Generalized anxiety disorder: Secondary | ICD-10-CM

## 2021-02-25 NOTE — Progress Notes (Signed)
[facility] Counselor/Therapist Progress Note  Patient ID: Tracey Norris, MRN: [id number],    Date: 02/25/2021  Time Spent: 55 minutes   Treatment Type: Individual Therapy  Reported Symptoms: anxiety, overtalking, overthinking  Mental Status Exam:  Appearance:   Casual     Behavior:  Appropriate, Sharing, and Motivated  Motor:  Normal  Speech/Language:   Clear and Coherent and overtalking  Affect:  anxious  Mood:  anxious  Thought process:  goal directed  Thought content:    Rumination and overthinking  Sensory/Perceptual disturbances:    WNL  Orientation:  oriented to person, place, time/date, situation, day of week, month of year, year, and stated date of Feb. 16, 2023  Attention:  Fair  Concentration:  Good "unless there's background noise in the area"  Memory:  When my anxiety is high, my memory isn't  quite as good  Fund of knowledge:   Good  Insight:    Good and Fair  Judgment:   Good  Impulse Control:  Good, Fair, and "working on it"   Risk Assessment: Danger to Self:  No Self-injurious Behavior: No Danger to Others: No Duty to Warn:no Physical Aggression / Violence:No  Access to Firearms a concern: No  Gang Involvement:No   Subjective: Patient in today reporting anxiety, overtalking, and overthinking which we worked further on today in session.  Patient seeming more motivated and actually practicing some of her goal-directed behaviors without prompting, at least on 2 occasions, which reflects progress for her. States she's had a more stressful couple of weeks with heightened symptoms, which she processed today and shared how she is wanting to be able to get out some but the anxiety takes over. Discussed this more with patient as well as some strategies for trying to get out rather than assuming "I just can't" as she hadn't really tried recently. Talked through her fears and anxious thoughts as well as how she can manage those anxious thoughts and fears by  confronting them and not allowing them to block her from having enjoyable activities outside the home. Also worked on improving her low self-confidence which heavily impacts her belief in herself to make positive changes that she has felt were impossible. Did not focus as much on her overtalking today as she really needed to work more on anxiety reduction and did well in staying more on task. Expresses more motivation and desire for change. Hoping to take overnight trip with husband within next 2 wks which will be her first time getting out since Covid shutdown. Reviewed strategies for patient to keep working between sessions on her goals to decrease her overtalking and overthinking, and letting her husband assist with observing and supporting her. Also encouraged her to intentionally try to reduce her assuming the negatives versus positives as the negatives accelerate her anxiety.  Interventions: Solution-Oriented/Positive Psychology, Ego-Supportive, and Insight-Oriented   Treatment goal plan: Patient not signing treatment plan on computer screen due to Covid. Treatment goals: Treatment goals remain on treatment plan as patient works with strategies to achieve her goals.  Progress is assessed each session and documented in "progress" section of treatment note.  Long-term goal: Reduce overall level, frequency, and intensity of the anxiety so that daily functioning is not impaired. Short-term goal: Increase understanding of beliefs and messages that produce the worry and anxiety. Strategies: Explore cognitive messages that mediate anxiety response and retrain in adaptive cognitions.  Diagnosis:   ICD-10-CM   1. Generalized anxiety disorder  F41.1  Plan:  Patient today showing some continued progress with her goal-directed behaviors as she works to decrease her depression and anxiety, her overthinking, her tendency to imagining worst case scenarios, and her over-talking.  These characteristics  have tended to negatively impact her relationships within the family and beyond inpatient especially is working on the decreasing of her chronic over-talking, which she has experienced over "a number of years" and feels it has definitely added to anxiety and depression at times, but is a very tough habit for her to change.  Did well in her work today in session confronting anxiety, depression, overthinking, and over talking, with the most emphasis going towards anxiety reduction which includes all of the symptoms just named.  She does show concern that her relationships are impacted negatively by her having so much difficulty not listening and over talking and she feels this also aggravates her anxiety.  Is recognizing that some of her discomfort and anxiety can actually help motivate her, which she had not looked at her situation in that way before.  We discussed this further in session today, which seemed to help patient believe a little more in herself and ability to make changes. Shows some increase in her motivation today as session went along.  Starting to show a little more belief in herself which is encouraging. Encouraged patient in her practice of more positive behaviors including: Recognizing her improvement and reflecting on it every 1-2 days, staying in the present focusing on what she can change, working on having moments of silence occasionally throughout the day, stay in touch with people who are supportive, get outside some daily and walk, intentionally look for more positives versus negatives daily, stop assuming worst case scenarios, healthy nutrition and exercise, remain on her prescribed medication, practice consistent positive self talk, believe in herself and her ability to make tough changes, consider using some helpful books or podcasts/daily affirmations that can help her in her goal-directed behaviors, practice more intentional listening to others, reduce overthinking and over analyzing,  challenge and counteract self doubt, practice interrupting negative/anxious/fearful thoughts and challenge them with the hope that she can eventually replace them with more reality-based thoughts, letting go of things including guilt that hold her back from moving forward, continue her work to decrease the perfectionistic tendencies, and recognize the strength that she shows when she works with goal-directed behaviors to move in a direction that supports improved emotional health.  Goal review and progress/challenges noted with patient.  Next appointment within 2 to 3 weeks.  This record has been created using [employer].  Chart creation errors have been sought, but may not always have been located and corrected.  Such creation errors do not reflect on the standard of medical care provided.   Shanon Ace, LCSW

## 2021-03-09 DIAGNOSIS — B370 Candidal stomatitis: Secondary | ICD-10-CM | POA: Diagnosis not present

## 2021-03-11 ENCOUNTER — Other Ambulatory Visit: Payer: Self-pay

## 2021-03-11 ENCOUNTER — Ambulatory Visit (INDEPENDENT_AMBULATORY_CARE_PROVIDER_SITE_OTHER): Payer: Medicare Other | Admitting: Psychiatry

## 2021-03-11 DIAGNOSIS — F411 Generalized anxiety disorder: Secondary | ICD-10-CM

## 2021-03-11 NOTE — Progress Notes (Signed)
?    [facility] Counselor/Therapist Progress Note ? ?Patient ID: Neria Procter, MRN: [id number],   ? ?Date: 03/11/2021 ? ?Time Spent: 50 minutes  ? ?Treatment Type: Individual Therapy ? ?Reported Symptoms: anxiety, stressed, depression ? ?Mental Status Exam: ? ?Appearance:   Casual     ?Behavior:  Appropriate, Sharing, and Motivated  ?Motor:  Normal  ?Speech/Language:   Clear and Coherent  ?Affect:  Anxious, depressed, stressed  ?Mood:  anxious and depressed  ?Thought process:  goal directed  ?Thought content:    Obsessions and overthinking  ?Sensory/Perceptual disturbances:    WNL  ?Orientation:  oriented to person, place, time/date, situation, day of week, month of year, year, and stated date of March 11, 2021  ?Attention:  Good  ?Concentration:  Good and Fair  ?Memory:  "Not really memory issues but things don't always come to me as quickly as they used to."  ?Fund of knowledge:   Good  ?Insight:    Good and Fair  ?Judgment:   Good  ?Impulse Control:  Good  ? ?Risk Assessment: ?Danger to Self:  No ?Self-injurious Behavior: No ?Danger to Others: No ?Duty to Warn:no ?Physical Aggression / Violence:No  ?Access to Firearms a concern: No  ?Gang Involvement:No  ? ?Subjective:  Patient in today reporting anxiety, overthinking, overtalking, depressed, and stressed. "This is a rough period more recently and had some sadness as this week was birthday of brother-in-law who died and was like a brother to patient. "I'm sad that I can't really travel yet as I"m not ready emotionally yet". Had to miss recently planned trip. Talked through her anxieties that she feels is keeping her from being able to travel. Gave examples of how and why she worries and realizing that her anxieties date back to several yrs ago and she continues to recycle them, with low motivation. Difficult time today working to stop overtalking which is very hard and she tends to berate herself.Worked again today on her self-acceptance.  Getting very  frustrated with herself and feeling like "I am not doing a good job changing".  Seems that she may have been pushing herself a little too much and expecting major changes too quickly which we spoke about today and decided to slow down her homework in between sessions a bit and look more for "smaller gains" that would not be as stressful for her.  She reports also going through a medical issue recently that added to her stress.  Still encouraging her to get out of the house a little more without always saying to herself "I cannot".  Agrees that it is easy for her to say "I cannot" and then not try, which contributes to her being stuck sometimes.  Worked some more today on her low self confidence and agrees that as she makes more progress that would likely affect her confidence in a positive way.  Ran out of time today however plan to pick up on this again next session. ?Did encourage her to continue working on not assuming the negatives in all situations as that also increases her anxiety. ? ?Interventions: Ego-Supportive and Insight-Oriented ? ?Treatment goal plan: ?Patient not signing treatment plan on computer screen due to Covid. ?Treatment goals: ?Treatment goals remain on treatment plan as patient works with strategies to achieve her goals.  Progress is assessed each session and documented in "progress" section of treatment note.  ?Long-term goal: ?Reduce overall level, frequency, and intensity of the anxiety so that daily functioning is not impaired. ?Short-term goal: ?  Increase understanding of beliefs and messages that produce the worry and anxiety. ?Strategies: ?Explore cognitive messages that mediate anxiety response and retrain in adaptive cognitions. ?  ?Diagnosis: ?  ICD-10-CM   ?1. Generalized anxiety disorder  F41.1   ?  ? ?Plan:  Patient today showing continued efforts with her goal-directed behaviors however was disappointed that she did not reach a couple goals she had for this past couple of weeks  and found herself not putting forth as much effort.  Discussed her feelings of discouragement and encouraged her to feel good about the efforts she did take but also trying to increase her efforts so that she can see more gains.  Did encourage her to keep her goals reasonable as her change would not likely happen real quickly, but rather more gradually and if she can just notice even incremental changes in a positive direction and that is a good thing.  Gave some clear examples of what this might look like for her and that seemed to make it more understandable for her.  Not much change in her depression nor anxiety nor her overthinking, but she does seem to be engaging better and actually trying to listen better and retain when we are talking about goal-directed behaviors.  Encouraging her to believe in herself more which is difficult for her.  Trying to pay more attention to herself in conversations especially around the issues of listening and not over talking, and she feels like she may be recognizing it more even if it may be after the fact and not while the conversation is occurring.  Emphasized the positiveness of her efforts.  Encouraged patient in her practice of more positive behaviors including: Recognizing her improvement and reflecting on it daily, staying in the present focusing on what she can change, working on having moments of silence occasionally throughout the day, stay in touch with people who are supportive, get outside some daily and walk, intentionally look for more positives versus negatives daily, stop assuming worst case scenarios, healthy nutrition and exercise, remain on her prescribed medication, practice consistent positive self talk, believe in herself and her ability to make tough changes, consider using some helpful books or podcasts/daily affirmations that can help her in her goal-directed behaviors, practice more intentional listening to others, reduce overthinking and over  analyzing, challenge and counteract self doubt, practice interrupting negative/anxious/fearful thoughts and challenge them with the hope that she can eventually replace them with more reality-based thoughts, letting go of things including guilt that hold her back from moving forward, continue her work to decrease the perfectionistic tendencies, and recognize the strengths she shows working with goal-directed behaviors to move in a direction that supports her improved emotional health. ? ?Goal review and progress/challenges noted with patient. ? ?Next appointment within 2 to 3 weeks. ? ?This record has been created using [employer].  Chart creation errors have been sought, but may not always have been located and corrected.  Such creation errors do not reflect on the standard of medical care provided. ? ?Shanon Ace, LCSW ? ? ? ? ? ? ? ? ? ? ? ? ? ? ? ? ? ? ?

## 2021-03-13 DIAGNOSIS — K140 Glossitis: Secondary | ICD-10-CM | POA: Diagnosis not present

## 2021-03-17 DIAGNOSIS — R5382 Chronic fatigue, unspecified: Secondary | ICD-10-CM | POA: Diagnosis not present

## 2021-03-17 DIAGNOSIS — Z1231 Encounter for screening mammogram for malignant neoplasm of breast: Secondary | ICD-10-CM | POA: Diagnosis not present

## 2021-03-17 DIAGNOSIS — D510 Vitamin B12 deficiency anemia due to intrinsic factor deficiency: Secondary | ICD-10-CM | POA: Diagnosis not present

## 2021-03-17 DIAGNOSIS — F419 Anxiety disorder, unspecified: Secondary | ICD-10-CM | POA: Diagnosis not present

## 2021-03-17 DIAGNOSIS — E559 Vitamin D deficiency, unspecified: Secondary | ICD-10-CM | POA: Diagnosis not present

## 2021-03-17 DIAGNOSIS — E785 Hyperlipidemia, unspecified: Secondary | ICD-10-CM | POA: Diagnosis not present

## 2021-03-17 DIAGNOSIS — I10 Essential (primary) hypertension: Secondary | ICD-10-CM | POA: Diagnosis not present

## 2021-03-23 DIAGNOSIS — K130 Diseases of lips: Secondary | ICD-10-CM | POA: Diagnosis not present

## 2021-03-23 DIAGNOSIS — L821 Other seborrheic keratosis: Secondary | ICD-10-CM | POA: Diagnosis not present

## 2021-03-25 ENCOUNTER — Ambulatory Visit (INDEPENDENT_AMBULATORY_CARE_PROVIDER_SITE_OTHER): Payer: Medicare Other | Admitting: Psychiatry

## 2021-03-25 ENCOUNTER — Other Ambulatory Visit: Payer: Self-pay

## 2021-03-25 DIAGNOSIS — F411 Generalized anxiety disorder: Secondary | ICD-10-CM

## 2021-03-25 NOTE — Progress Notes (Signed)
?    [facility] Counselor/Therapist Progress Note ? ?Patient ID: Tracey Norris, MRN: [id number],   ? ?Date: 03/25/2021 ? ?Time Spent: 55 minutes  ? ?Treatment Type: Individual Therapy ? ?Reported Symptoms: anxiety, "over-talkative", easy to feel nervous, worries a lot and "what if's" and assumes the negative; some physical issue recently that led to more anxiety ? ?Mental Status Exam: ? ?Appearance:   Neat     ?Behavior:  Appropriate, Sharing, and Motivated  ?Motor:  Normal  ?Speech/Language:   Clear and Coherent and over talkative  ?Affect:  anxious  ?Mood:  anxious  ?Thought process:  tangential  ?Thought content:    Rumination  ?Sensory/Perceptual disturbances:    WNL  ?Orientation:  oriented to person, place, time/date, situation, day of week, month of year, year, and stated date of March 25, 2021  ?Attention:  Fair  ?Concentration:  Fair  ?Memory:  WNL  ?Fund of knowledge:   Good  ?Insight:    Fair  ?Judgment:   Good and Fair  ?Impulse Control:  Fair  ? ?Risk Assessment: ?Danger to Self:  No ?Self-injurious Behavior: No ?Danger to Others: No ?Duty to Warn:no ?Physical Aggression / Violence:No  ?Access to Firearms a concern: No  ?Gang Involvement:No  ? ?Subjective:   In today reporting anxiety, overthinking, overtalking, some depression. Had a health issue ("sore in her mouth") that reportedly family had told her she blew it out of proportion and created a lot of stress and anxiety over it. It is definitely healing now and feeling some relief but patient finding it hard not to still be anxious and worried. Discussed this further, including how she wants to be, and she sees how her issues do negatively impact family relationships. Also relating how she has had lost several good friends within approx 3 years and this  has had quite an effect on patient and she today shares the losses have been a part of her overtalking and looking more for what might go wrong. Scared as she assumes the negative. Was able to  explain today how she and husband both feel that these deaths have impacted patient and led to increased symtoms of anxiety and sadness at times. "Never let go of it but don't want to keep hanging onto it either."Did well in talking through this today and able to share some connections with some unresolved grief and her anxiety, overtalking, overthinking, and some depression. Encouraging patient to have patience with herself, focus on smaller gains, and began having more contact with others, get out of the house more, and follow up on recommendations made in session today.  Also discouraged her use of the words "I cannot" as that affects how much she would really try in making changes.  Seems more motivated upon leaving today.  ?  ? ?Interventions: Solution-Oriented/Positive Psychology, Ego-Supportive, and Insight-Oriented ? ?Treatment goal plan: ?Patient not signing treatment plan on computer screen due to Covid. ?Treatment goals: ?Treatment goals remain on treatment plan as patient works with strategies to achieve her goals.  Progress is assessed each session and documented in "progress" section of treatment note.  ?Long-term goal: ?Reduce overall level, frequency, and intensity of the anxiety so that daily functioning is not impaired. ?Short-term goal: ?Increase understanding of beliefs and messages that produce the worry and anxiety. ?Strategies: ?Explore cognitive messages that mediate anxiety response and retrain in adaptive cognitions. ? ? ?Diagnosis: ?  ICD-10-CM   ?1. Generalized anxiety disorder  F41.1   ?  ? ?Plan:  Patient  today showing ongoing efforts in working with her goal-directed behaviors and in being more patient with herself and keeping her expectations realistic considering the stress that patient lives with.  Worked with some self-esteem issues today to try to increase patient's belief in herself and that she can make significant changes in her life going forward.  Encouraged her not to assume  the negatives and to try to see more positives in her life and in relationships with others.  Supported her getting out of the house more and connecting with other people more. Encouraged patient in her practice of more positive behaviors including: Work on having moments of silence occasionally as needed throughout the day to give herself breaks, recognizing her improvement and reflecting on it daily, staying in the present focusing on what she can change, stay in touch with people who are supportive, get outside some daily and walk weather permitting, intentionally look for more positives versus negatives daily stop assuming worst case scenarios, healthy nutrition and exercise, remain on her prescribed medication, practice consistent positive self talk, believe in herself and her ability to make tough changes, consider using some helpful books or podcasts/daily affirmations that can help her in her goal-directed behaviors, practice more intentional listening to others, reduce overthinking and over analyzing, challenge and counteract self doubt, practice interrupting negative/anxious/fearful thoughts and challenge them with the hope that she can eventually replace them with more reality-based thoughts, letting go of things including guilt that hold her back from moving forward, continue her work to decrease the perfectionistic tendencies and realize the strength she shows working with goal-directed behaviors to move in a direction that supports her improved emotional health and overall wellbeing. ? ?Goal review and progress/challenges noted with patient. ? ?Next appointment within and 2 to 3 weeks. ? ?This record has been created using [employer].  Chart creation errors have been sought, but may not always have been located and corrected.  Such creation errors do not reflect on the standard of medical care provided. ? ? ?Shanon Ace, LCSW ? ? ? ? ? ? ? ? ? ? ? ? ? ? ? ? ? ? ?

## 2021-04-08 ENCOUNTER — Ambulatory Visit (INDEPENDENT_AMBULATORY_CARE_PROVIDER_SITE_OTHER): Payer: Medicare Other | Admitting: Psychiatry

## 2021-04-08 DIAGNOSIS — F411 Generalized anxiety disorder: Secondary | ICD-10-CM

## 2021-04-08 NOTE — Progress Notes (Signed)
?    [facility] Counselor/Therapist Progress Note ? ?Patient ID: Tracey Norris, MRN: [id number],   ? ?Date: 04/08/2021 ? ?Time Spent: 55 minutes  ? ?Treatment Type: Individual Therapy ? ?Reported Symptoms: anxiety, some depression, over-talking (affects family and relationships) ? ?Mental Status Exam: ? ?Appearance:   Casual     ?Behavior:  Appropriate, Sharing, and Motivated  ?Motor:  Normal  ?Speech/Language:   Clear and Coherent  ?Affect:  anxious  ?Mood:  anxious  ?Thought process:  Some tangentiality  ?Thought content:    Tangential  ?Sensory/Perceptual disturbances:    WNL  ?Orientation:  oriented to person, place, time/date, situation, day of week, month of year, year, and stated date of April 08, 2021  ?Attention:  Good  ?Concentration:  Fair  ?Memory:  Some short term issues and Dr is aware  ?Fund of knowledge:   Good  ?Insight:    Good and Fair  ?Judgment:   Good  ?Impulse Control:  Fair  ? ?Risk Assessment: ?Danger to Self:  No ?Self-injurious Behavior: No ?Danger to Others: No ?Duty to Warn:no ?Physical Aggression / Violence:No  ?Access to Firearms a concern: No  ?Gang Involvement:No  ? ?Subjective:  Patient in today reporting anxiety, overthinking, some depression, and "anxious over talking". Shares today how her son is much like her in terms of over talking, and daughter is more like patient's husband. Describes her "jumping ahead" and hard to focus in on specifics in conversation and she tends to talk non-stop and over-analyzing and "picking things apart" and "trying to stop". Worked today with specific examples and trying to interrupt anxious thoughts to challenge and replace them with more encouraging and empowering thoughts. Also reports she and husband have been getting out more and notices "feeling more normal but I'm not sure what more normal is". Some small amounts of effort and progress. To practice the same anxious thought interruption, pausing in conversations as we worked on today,  staying on track in conversations, and taking some time away with her husband just to have fun.  Was able to focus more on herself today rather than going off in different directions about other people.  After session husband reports that he is seeing some bits of progress also in patient which was affirming for her.  Working hard not to dwell on the negative and what might go wrong versus right.  Encouraging patient still to have patience with herself, noticing the smaller gains, trying to increase her contact with other people socially and getting out of the house more.  Also encouraged her in practicing positive self talk and not expecting unrealistic gains which we discussed more today in session. ? ?Interventions: Solution-Oriented/Positive Psychology, Ego-Supportive, and Insight-Oriented ? ?Treatment goal plan: ?Patient not signing treatment plan on computer screen due to Covid. ?Treatment goals: ?Treatment goals remain on treatment plan as patient works with strategies to achieve her goals.  Progress is assessed each session and documented in "progress" section of treatment note.  ?Long-term goal: ?Reduce overall level, frequency, and intensity of the anxiety so that daily functioning is not impaired. ?Short-term goal: ?Increase understanding of beliefs and messages that produce the worry and anxiety. ?Strategies: ?Explore cognitive messages that mediate anxiety response and retrain in adaptive cognitions. ? ?Diagnosis: ?  ICD-10-CM   ?1. Generalized anxiety disorder  F41.1   ?  ? ? ?Plan: Patient in today showing motivation and good participation in working further with her goal-directed behaviors and trying to lessen her over talking, over analyzing,  avoidant behaviors.  Worked well with some specific examples today and her trying to decrease the over analyzing and over talking and going off on tangents.  Patient wants to get better with this as she states it affects her and relationships with other people  and her comfort level and being out amongst other people.  Has felt so out of control with this in the past but has experienced a few days that were better since her last appointment and that seems to have given her a little more hope that she can make important changes that can lead to better relationships and some better communication within her marriage.  Encouraged her to keep her expectations realistic and be patient with herself as she works on changes. Encouraged patient in her practice of more positive behaviors including: Staying in the present focusing on what she can change, work on having moments of silence occasionally as needed throughout the day to give herself breaks and to meditate, recognize her improvement and reflect on it daily, stay in touch with people who are supportive, get outside daily and walk, intentionally look for more positives versus negatives daily, stop assuming worst case scenarios, healthy nutrition and exercise, remain on prescribed medication, practice consistent positive self talk, believe in herself and her ability to make tough changes, consider using some helpful books or podcast/daily affirmations that can help her in her goal-directed behaviors, practice more intentional listening to others, reduce overthinking and over analyzing, challenge and counteract self doubt, practice interrupting negative/anxious/fearful thoughts and challenge them with the hope that she can eventually replace them with more reality-based thoughts, letting go of things including guilt that hold her back from moving forward, continue her work to decrease the perfectionistic tendencies, and realize the strength she shows working with goal-directed behaviors to move in a direction that supports her improved emotional health. ? ?Goal review and progress/challenges noted with patient. ? ?Next appointment within 3 weeks. ? ?This record has been created using [employer].  Chart creation errors have  been sought, but may not always have been located and corrected.  Such creation errors do not reflect on the standard of medical care provided. ? ? ?Shanon Ace, LCSW ? ? ? ? ? ? ? ? ? ? ? ? ? ? ? ? ? ? ?

## 2021-04-15 DIAGNOSIS — B370 Candidal stomatitis: Secondary | ICD-10-CM | POA: Diagnosis not present

## 2021-04-19 DIAGNOSIS — D510 Vitamin B12 deficiency anemia due to intrinsic factor deficiency: Secondary | ICD-10-CM | POA: Diagnosis not present

## 2021-04-22 ENCOUNTER — Ambulatory Visit: Payer: Medicare Other | Admitting: Psychiatry

## 2021-05-04 DIAGNOSIS — Z9012 Acquired absence of left breast and nipple: Secondary | ICD-10-CM | POA: Diagnosis not present

## 2021-05-04 DIAGNOSIS — Z853 Personal history of malignant neoplasm of breast: Secondary | ICD-10-CM | POA: Diagnosis not present

## 2021-05-04 DIAGNOSIS — Z08 Encounter for follow-up examination after completed treatment for malignant neoplasm: Secondary | ICD-10-CM | POA: Diagnosis not present

## 2021-05-06 ENCOUNTER — Ambulatory Visit (INDEPENDENT_AMBULATORY_CARE_PROVIDER_SITE_OTHER): Payer: Medicare Other | Admitting: Psychiatry

## 2021-05-06 DIAGNOSIS — F411 Generalized anxiety disorder: Secondary | ICD-10-CM

## 2021-05-06 NOTE — Progress Notes (Signed)
?    [facility] Counselor/Therapist Progress Note ? ?Patient ID: Tracey Norris, MRN: [id number],   ? ?Date: 4/27/Norris ? ?Time Spent: 55 minutes  ? ?Treatment Type: Individual Therapy ? ?Reported Symptoms: anxiety, over-talking (affects relationships) ? ?Mental Status Exam: ? ?Appearance:   Casual     ?Behavior:  Sharing and nervousness/anxious  ?Motor:  Nervous/restless initially and became better over session  ?Speech/Language:   Clear and Coherent  ?Affect:  anxious  ?Mood:  anxious and some depression  ?Thought process:  goal directed  ?Thought content:    Some ruminating and overthinking  ?Sensory/Perceptual disturbances:    WNL  ?Orientation:  oriented to person, place, time/date, situation, day of week, month of year, year, and stated date of Tracey Norris  ?Attention:  Fair  ?Concentration:  Fair  ?Memory:  WNL  ?Fund of knowledge:   Good  ?Insight:    Good and Fair  ?Judgment:   Good  ?Impulse Control:  Good and Fair  ? ?Risk Assessment: ?Danger to Self:  No ?Self-injurious Behavior: No ?Danger to Others: No ?Duty to Warn:no ?Physical Aggression / Violence:No  ?Access to Firearms a concern: No  ?Gang Involvement:No  ? ?Subjective: Patient in today reporting anxiety, overthinking, over talking and very exaggerated when anxious, and some depression.  The over talking is a very long-term issue for her that she has recently begun treatment to try and change.  Has reported that other family members struggle with similar issues but are "more mild".  Was very anxious and nervous today initially but did calm quite a bit over the session.  Work specifically on some very current issues that she is trying to address and that is her difficulty in leaving home to go anywhere other than nearby places.  Shares how she gets very very anxious before she ever leaves the house and just has not been able to go very far.  Discussed her initial anxiety on these occasions and she states "I just cannot do it because I will  panic".  "I worry about everything from the moment I believe and I would be worrying the whole time".  I noticed in her elaborating more that she was constantly predicting how anxious she would be and how she cannot make any progress because of her anxiety.  Worked specifically in session on her contradicting these messages to herself about what will happen if she leaves the home and that she cannot do it, as she repeats that sentence over and over again and essentially telling herself she will not be able to do it which translates into her not trying.  Patient receptive to some discussion on this but did feel like down with her anxiety.  We discussed this further and she seemed to be taking a few small steps in a better direction towards understanding that she is tripping herself up by making the assumption that she is going to be overly anxious and predicting how awful it will be.  She actually seemed interested in this and talked about it further and wanted [place] to speak at the end session with her husband also so that he would be on track with what she is working on most currently, as he is her main support.  Patient has tended to jump ahead and over talk nonstop at times while over analyzing and picking things apart and finding reasons why strategies would not be successful.  Encouraged patient to consider what we worked on today and some small changes that  we spoke about in terms of not assuming how anxious she might feel into the future but rather focus on the present and taking it step by step, staying in the moment, and being willing to work on some of the steps we discussed today.  Also encouraging patient to have patience with herself, use some positive self talk, and pay attention to small gains in trying to build upon them especially as it would pay off and her having more contact with other people socially and being able to get out more with increased distances from her house. ? ?Interventions:  Solution-Oriented/Positive Psychology, Ego-Supportive, and Insight-Oriented ? ?Treatment goal plan: ?Patient not signing treatment plan on computer screen due to Covid. ?Treatment goals: ?Treatment goals remain on treatment plan as patient works with strategies to achieve her goals.  Progress is assessed each session and documented in "progress" section of treatment note.  ?Long-term goal: ?Reduce overall level, frequency, and intensity of the anxiety so that daily functioning is not impaired. ?Short-term goal: ?Increase understanding of beliefs and messages that produce the worry and anxiety. ?Strategies: ?Explore cognitive messages that mediate anxiety response and retrain in adaptive cognitions. ? ? ?Diagnosis: ?  ICD-10-CM   ?1. Generalized anxiety disorder  F41.1   ?  ? ?Plan: Patient today showing motivation and active participation in session as she focused on more goal directed behaviors in better managing her heightened anxiety and panic feelings.  Used specific examples today which seemed to be helpful to patient and also role played in session how she can use different ways of talking to herself and encouraging herself rather than always automatically assuming "I cannot do it" when it comes to facing her anxiety and doing things like leaving the home anyway.  She is wanting to eventually be able to go places further away from her home and even be able to take a trip with her husband but her pattern has been to only go places close to their home.  Husband is very much on board and a supporter of patient and at the end of session per patient request we spoke with husband about some of the things we processed in session as far as managing her anxiety and not letting herself make assumptions of "I cannot" and instead follow through in the steps of actually leaving the home and going to certain destinations.  Also emphasized it is not a matter of her making a sudden change but that the steps we spoke about  today and serve as stepping stones to get more of where she wants to be, and not be held captive by her anxiety. Encouraged patient as she tries to practice more positive behaviors including: Keeping her expectations realistic and be patient with herself as she works on changes, staying in the present focusing on what she can change, work on having moments of silence occasionally as needed throughout the day to give herself breaks and to meditate, recognize her improvement and reflecting on it often, stay in touch with people who are supportive, get outside daily and walk, intentionally look for more positives versus negatives daily, refrain from assuming worst case scenarios, healthy nutrition and exercise, remain on prescribed medication, practice consistent positive self talk, believe in herself and her ability to make tough changes, consider using some helpful books or podcasts/daily affirmations that can help her in her goal directed behaviors, practice more intentional listening to others, reduce overthinking and over analyzing, challenge and counteract self doubt, practice interrupting negative/anxious/fearful thoughts  and challenge them as she tries to replace them with more reality-based thoughts, letting go of things including guilt that hold her back from moving forward, continue her work to decrease the perfectionistic tendencies, and recognize the strength she shows working with goal-directed behaviors to move in a direction that supports her improved emotional health and her relationships with others. ? ?Goal review and progress/challenges noted with patient. ? ?Next appointment within 3 weeks. ? ?This record has been created using [employer].  Chart creation errors have been sought, but may not always have been located and corrected.  Such creation errors do not reflect on the standard of medical care provided. ? ? ?Shanon Ace, LCSW ? ? ? ? ? ? ? ? ? ? ? ? ? ? ? ? ? ? ?

## 2021-05-20 ENCOUNTER — Ambulatory Visit (INDEPENDENT_AMBULATORY_CARE_PROVIDER_SITE_OTHER): Payer: Medicare Other | Admitting: Psychiatry

## 2021-05-20 DIAGNOSIS — F411 Generalized anxiety disorder: Secondary | ICD-10-CM

## 2021-05-20 DIAGNOSIS — D510 Vitamin B12 deficiency anemia due to intrinsic factor deficiency: Secondary | ICD-10-CM | POA: Diagnosis not present

## 2021-05-20 NOTE — Progress Notes (Signed)
?    [facility] Counselor/Therapist Progress Note ? ?Patient ID: Tracey Norris, MRN: [id number],   ? ?Date: 05/20/2021 ? ?Time Spent: 55 minutes  ? ?Treatment Type: Individual Therapy ? ?Reported Symptoms: anxiety, overthinking ? ?Mental Status Exam: ? ?Appearance:   Casual     ?Behavior:  Appropriate, Sharing, and Motivated  ?Motor:  Normal  ?Speech/Language:   Clear and Coherent  ?Affect:  anxious  ?Mood:  anxious  ?Thought process:  goal directed  ?Thought content:    Rumination  ?Sensory/Perceptual disturbances:    WNL  ?Orientation:  oriented to person, place, time/date, situation, day of week, month of year, year, and stated date of May 20, 2021  ?Attention:  Fair  ?Concentration:  Fair  ?Memory:  "Only when I'm  anxious"  ?Fund of knowledge:   Good  ?Insight:    Good and Fair  ?Judgment:   Good  ?Impulse Control:  Good and Fair  ? ?Risk Assessment: ?Danger to Self:  No ?Self-injurious Behavior: No ?Danger to Others: No ?Duty to Warn:no ?Physical Aggression / Violence:No  ?Access to Firearms a concern: No  ?Gang Involvement:No  ? ?Subjective:  Patient in today reporting overthinking, over talking, anxiety, and some depression. Symptoms mostly related to personal, family, as her longer term anxiety impacts each of these areas. States she is working on decreasing her over-talking and overthinking but not much progress yet, and she acknowledges that feeds her anxiety. Reports working on interrupting anxious thoughts/assumptions and was actually able to successfully go with husband on 2-night trip away from home still with some anxiety "but I did lot let it block me from going."  Still struggling with anxiety but feeling good about the small improvement she's noticed by going on recent trip. Today needed to talk through her concerns about her sister in retirement facility with health concerns, and also patient's husband and some health issues for which he is undergoing tests next week. Hard for patient not  to worry and assume the worst, and worked on this in session today. Trying to interrupt automatic thoughts that lead to more anxiety and worry, and instead realize that the worrying does not help her emotionally.  Still struggles with over analyzing, over talking, and picking things apart from when others are talking, often assuming that nothing is going to help the issues she is having in communication.  Starting to realize some that her over talking is getting in the way of better communication within her family and others.  Wants to "hope for the best" but finds this hard to practice which we discussed more today. Wanting to spend more time outdoors which can help her overall mood.  Reviewed deep breathing exercises before session end. ? ?Interventions: Solution-Oriented/Positive Psychology and Insight-Oriented ? ?Treatment goal plan: ?Patient not signing treatment plan on computer screen due to Covid. ?Treatment goals: ?Treatment goals remain on treatment plan as patient works with strategies to achieve her goals.  Progress is assessed each session and documented in "progress" section of treatment note.  ?Long-term goal: ?Reduce overall level, frequency, and intensity of the anxiety so that daily functioning is not impaired. ?Short-term goal: ?Increase understanding of beliefs and messages that produce the worry and anxiety. ?Strategies: ?Explore cognitive messages that mediate anxiety response and retrain in adaptive cognitions. ?  ?Diagnosis: ?  ICD-10-CM   ?1. Generalized anxiety disorder  F41.1   ?  ? ?Plan:  Patient today showing motivation and active participation however hard to stay on track in the work  on decreasing her anxiety and over talking.  She had made some progress since last session which actually made it possible for her and her husband to go away on a 2 night trip without patient's excessive anxiety getting in the way.  States that she remains on the Xanax that her doctor prescribes and that  seems to help "a little bit at the time".  Encouraged her to use that as prescribed but also continue focusing on the strategies that we are working on in sessions and that she has had some success with enabling her and husband to go on their recent trip away for 2 nights.  Patient states she is not sure how long it has been but that it is "been a long time since we have been able to make even a 2 night trip because of my anxiety".  Encouraged her continued goal-directed behaviors and to follow up daily with them. Encouraged patient to practice more positive behaviors including: Staying in the present focusing on what she can change, keeping her expectations realistic and having patience with herself as she continues to work on changes that are challenging, work on having moments of silence occasionally as needed throughout the day to give herself breaks and to meditate, recognize her improvement and reflect on it often, stay in touch with people who are supportive, get outside daily and walk, intentionally look for more positives versus negatives, refrain from assuming worst case scenarios, healthy nutrition and exercise, remain on her prescribed medication, practice consistent positive self talk, believe in herself and her ability to make tough changes, consider using some helpful books or podcast, use of daily affirmations that can help her in goal directed behaviors, practice more intentional listening to others especially before responding, reduce overthinking and over analyzing, challenge and counteract self doubt, practice interrupting negative/anxious/fearful thoughts and challenge them as she tries to replace them with more realistic and empowering thoughts, letting go of things including guilt that hold her back from moving forward, continue working to decrease her perfectionistic tendencies, and realize the strength she shows when she works with goal-directed behaviors to move in a direction that supports  her improved emotional health and progress on goals. ? ?Goal review and progress/challenges noted with patient. ? ?Next appointment within 2 to 3 weeks. ? ?This record has been created using [employer].  Chart creation errors have been sought, but may not always have been located and corrected.  Such creation errors do not reflect on the standard of medical care provided. ? ? ?Shanon Ace, LCSW ? ? ? ? ? ? ? ? ? ? ? ? ? ? ? ? ? ? ?

## 2021-06-03 ENCOUNTER — Ambulatory Visit: Payer: Medicare Other | Admitting: Psychiatry

## 2021-06-17 ENCOUNTER — Ambulatory Visit (INDEPENDENT_AMBULATORY_CARE_PROVIDER_SITE_OTHER): Payer: Medicare Other | Admitting: Psychiatry

## 2021-06-17 DIAGNOSIS — I10 Essential (primary) hypertension: Secondary | ICD-10-CM | POA: Diagnosis not present

## 2021-06-17 DIAGNOSIS — D510 Vitamin B12 deficiency anemia due to intrinsic factor deficiency: Secondary | ICD-10-CM | POA: Diagnosis not present

## 2021-06-17 DIAGNOSIS — F411 Generalized anxiety disorder: Secondary | ICD-10-CM | POA: Diagnosis not present

## 2021-06-17 DIAGNOSIS — E785 Hyperlipidemia, unspecified: Secondary | ICD-10-CM | POA: Diagnosis not present

## 2021-06-17 DIAGNOSIS — F419 Anxiety disorder, unspecified: Secondary | ICD-10-CM | POA: Diagnosis not present

## 2021-06-17 DIAGNOSIS — Z1231 Encounter for screening mammogram for malignant neoplasm of breast: Secondary | ICD-10-CM | POA: Diagnosis not present

## 2021-06-17 DIAGNOSIS — E559 Vitamin D deficiency, unspecified: Secondary | ICD-10-CM | POA: Diagnosis not present

## 2021-06-17 NOTE — Progress Notes (Signed)
[facility] Counselor/Therapist Progress Note  Patient ID: Tracey Norris, MRN: [id number],    Date: 06/17/2021  Time Spent: 50 minutes   Treatment Type: Individual Therapy  Reported Symptoms: anxiety, "worry all the time", some depression  Mental Status Exam:  Appearance:   Casual     Behavior:  Appropriate and Sharing  Motor:  Normal  Speech/Language:   Clear and Coherent  Affect:  Anxiety, some depression  Mood:  anxious and depressed  Thought process:  goal directed  Thought content:    Obsessions and overthinking  Sensory/Perceptual disturbances:    WNL  Orientation:  oriented to person, place, time/date, situation, day of week, month of year, year, and stated date of June 17, 2021  Attention:  Fair  Concentration:  Fair  Memory:  Only when I'm stressed or people talking too fast  Fund of knowledge:   Good  Insight:    Good and Fair  Judgment:   Good and Fair  Impulse Control:  Good   Risk Assessment: Danger to Self:  No Self-injurious Behavior: No Danger to Others: No Duty to Warn:no Physical Aggression / Violence:No  Access to Firearms a concern: No  Gang Involvement:No   Subjective:  Patient in today reporting anxiety, worrying a lot, some overtalking especially when anxious, and some depression. Big progress for her since last appt was for her to go on 3-day trip to [place] to visit her sister in long term care facility. States she "finally put my mind to it, began packing 3 days ahead, and tried to stay more positive versus looking for negatives. Some decrease in overthinking. Less depressed, and some decrease in anxiety "on some things." Able to interrupt some anxious/fearful thoughts and eventually replace with more reality-based thoughts. More motivated and believing some more in her ability to change and feel better and less worried. Still second guesses herself in some ways "but not in everything like I used to."  Realizing more now how her anxiety and  fearfulness has really gotten in the way of her within her friendships and within the family.  Motivated to continue working on goal-directed behaviors and being able to see her progress  Interventions: Cognitive Behavioral Therapy and Solution-Oriented/Positive Psychology  Treatment goals: Treatment goals remain on treatment plan as patient works with strategies to achieve her goals.  Progress is assessed each session and documented in "progress" section of treatment note.  Long-term goal: Reduce overall level, frequency, and intensity of the anxiety so that daily functioning is not impaired. Short-term goal: Increase understanding of beliefs and messages that produce the worry and anxiety. Strategies: Explore cognitive messages that mediate anxiety response and retrain in adaptive cognitions.  Diagnosis:   ICD-10-CM   1. Generalized anxiety disorder  F41.1      Plan:  Patient today showing good participation and motivation as she worked on her anxiety, over worrying, and some assumptions she had been making that were very self-limiting.  Was more actively involved in session today and as noted above, shared how she pushed through a lot of fear and anxiety and actually went on a 3-day trip to [place] with her husband to visit her sister.  Typically she has always backed out of trips at the last moment but she got through her "initial typical anxiety" and got in the car and left to go on the trip and stayed the entire time, and had a really good time per her report.  Stated that she "shocked myself" but  that the trip really helped her in terms of motivation and wanting to confront more of her fear and anxiety so that it was thought limiting her in such significant ways.  Worked further today with goal directed behaviors and accentuated the good work she has done since last session and how to repeat it more in her daily life to experience more success on her goals. Encouraged patient in her  practice of more positive behaviors including: Remaining in the present focusing on what she can change or control, keeping her expectations realistic and having patience with herself as she continues to work on changes that are challenging, work on having moments of silence occasionally as needed throughout the day to give herself breaks and to meditate, recognize her improvement and reflect on it often, stay in touch with people who are supportive, get outside daily and walk, intentionally look for more positives versus negatives, refrain from assuming worst case scenarios, healthy nutrition and exercise, remain on her prescribed medication, practice consistent positive self talk, believe in herself and her ability to make tough changes, consider using some helpful books or podcast's, use of daily affirmations that can help her in goal directed behaviors, practice more intentional listening to others especially before responding, reduce overthinking and over analyzing, challenge and counteract self doubt, practice interrupting negative/anxious/fearful thoughts and challenge them to replace with more realistic and empowering thoughts, letting go of things including guilt that hold her back from moving forward, continue working to decrease her perfectionistic tendencies, and recognize the strength she shows working with goal directed behaviors to move in a direction that supports her improved emotional health and overall wellbeing.  Goal review and progress/challenges noted with patient.  Next appointment within 3 weeks.  This record has been created using [employer].  Chart creation errors have been sought, but may not always have been located and corrected.  Such creation errors do not reflect on the standard of medical care provided.   Shanon Ace, LCSW

## 2021-06-21 DIAGNOSIS — D510 Vitamin B12 deficiency anemia due to intrinsic factor deficiency: Secondary | ICD-10-CM | POA: Diagnosis not present

## 2021-07-08 ENCOUNTER — Ambulatory Visit (INDEPENDENT_AMBULATORY_CARE_PROVIDER_SITE_OTHER): Payer: Medicare Other | Admitting: Psychiatry

## 2021-07-08 DIAGNOSIS — F411 Generalized anxiety disorder: Secondary | ICD-10-CM

## 2021-07-08 NOTE — Progress Notes (Signed)
[facility] Counselor/Therapist Progress Note  Patient ID: Tracey Norris, MRN: [id number],    Date: 07/08/2021  Time Spent: 55 minutes   Treatment Type: Individual Therapy  Reported Symptoms: anxiety, depression, obsessive thoughts, worrying  Mental Status Exam:  Appearance:   Casual     Behavior:  Appropriate, Sharing, and Motivated  Motor:  Normal  Speech/Language:   Clear and Coherent  Affect:  Anxiety, depressed  Mood:  anxious and depressed  Thought process:  goal directed  Thought content:    Obsessions and overthinking  Sensory/Perceptual disturbances:    WNL  Orientation:  oriented to person, place, time/date, situation, day of week, month of year, year, and stated date of July 08, 2021  Attention:  Fair  Concentration:  Fair  Memory:  Some short term memory concerns  Fund of knowledge:   Fair  Insight:    Good and Fair  Judgment:   Good  Impulse Control:  Good and Fair   Risk Assessment: Danger to Self:  No Self-injurious Behavior: No Danger to Others: No Duty to Warn:no Physical Aggression / Violence:No  Access to Firearms a concern: No  Gang Involvement:No   Subjective: Patient in today reporting anxiety, fearful thoughts, depression, worrying a lot, and obsessive thoughts. Struggles with overtalking and that feeds her anxiety. Today more stressed re: issues with and about husband. Husband waiting to have test in Sept, that had to be postponed because of having to be done in the hospital. Today needing to process the situation more due to her interactions in marital relationship being more volatile and stressful for her which heightens her her symptoms. Worked today with her obsessive worrying and anxiety which patient seemed to appreciate and sense some calmness at least during session. Mentioned the trip she was able to take prior to last visit and still feeling good about that. No traveling in near future due to husband's health issues that he doesn't  want her talking about. Still second-guesses herself but also showing some motivation in session and eventually staying on topic more   Interventions: Cognitive Behavioral Therapy, Ego-Supportive, and Insight-Oriented  Long-term goal: Reduce overall level, frequency, and intensity of the anxiety so that daily functioning is not impaired. Short-term goal: Increase understanding of beliefs and messages that produce the worry and anxiety. Strategies: Explore cognitive messages that mediate anxiety response and retrain in adaptive cognitions.  Diagnosis:   ICD-10-CM   1. Generalized anxiety disorder  F41.1      Plan:    Patient in today showing active participation and motivation working on her anxiety, over-worrying, depression, frustrations, overtalking, and obsessive thoughts.  Having a very difficult time with some family stressors and now with her husband having some concerns about his heart and having to have another procedure postponed so that they can do it in the hospital because of his heart issues.  So this has really stressed patient quite a bit, and once she vented in session today for quite a while, she was much more calm and rational in her talking and seemed to really appreciate the chance to talk and have some reassurance on her being a good support for her husband especially in midst of difficult situation.  To focus on good self-care for herself and continue some positive interactions with husband and giving him space when he wants space without interruption. Encouraged patient in her practice of more positive behaviors including: Remaining in the present and focusing on what she can change, keeping her  expectations realistic and having patience with herself as she works on changes that are challenging, work on having moments of silence occasionally as needed throughout the day to give herself breaks and to meditate, recognize her improvement and reflect on it often, stay in touch  with people who are supportive, get outside daily and walk, intentionally look for more positives versus negatives, refrain from assuming worst case scenarios, healthy nutrition and exercise, remain on her prescribed medication, practice consistent positive self talk, believe in herself and her ability to make tough changes, consider using some helpful books or podcast, practice more intentional listening to others especially before responding, reduce overthinking and over analyzing, counteract self doubt, continue working on her perfectionistic tendencies, and realize the strength she shows working with goal directed behaviors to move in a direction that supports her improved emotional health.  Goal review and progress/challenges noted with patient.  Next appointment within 3 weeks.  This record has been created using [employer].  Chart creation errors have been sought, but may not always have been located and corrected.  Such creation errors do not reflect on the standard of medical care provided.   Shanon Ace, LCSW

## 2021-07-22 DIAGNOSIS — D519 Vitamin B12 deficiency anemia, unspecified: Secondary | ICD-10-CM | POA: Diagnosis not present

## 2021-07-27 ENCOUNTER — Ambulatory Visit (INDEPENDENT_AMBULATORY_CARE_PROVIDER_SITE_OTHER): Payer: Medicare Other | Admitting: Psychiatry

## 2021-07-27 DIAGNOSIS — F411 Generalized anxiety disorder: Secondary | ICD-10-CM

## 2021-07-27 NOTE — Progress Notes (Signed)
[facility] Counselor/Therapist Progress Note  Patient ID: Tracey Norris, MRN: [id number],    Date: 07/27/2021  Time Spent: 50 minutes   Treatment Type: Individual Therapy  Reported Symptoms: anxiety, obsessive thoughts, excessive worrying, some depression  Mental Status Exam:  Appearance:   Casual     Behavior:  Appropriate, Sharing, and Motivated  Motor:  Normal  Speech/Language:   Clear and Coherent  Affect:  anxious  Mood:  anxious and some depression  Thought process:  goal directed  Thought content:    Obsessions and Rumination  Sensory/Perceptual disturbances:    WNL  Orientation:  oriented to person, place, time/date, situation, day of week, month of year, year, and stated date of July 27, 2021  Attention:  Good  Concentration:  Good and Fair  Memory:  Some short term memory issues and Dr is aware  Fund of knowledge:   Good  Insight:    Good and Fair  Judgment:   Good  Impulse Control:  Good   Risk Assessment: Danger to Self:  No Self-injurious Behavior: No Danger to Others: No Duty to Warn:no Physical Aggression / Violence:No  Access to Firearms a concern: No  Gang Involvement:No   Subjective:  Patient in today reporting anxiety, worrying a lot, fearful and obsessive thoughts, and depression.  Most anxiety currently related to husband's upcoming gastro testing. Also worried about herself and any health needs into the future, especially her worrying about worst case scenarios. Worked on these anxious/fearful thoughts/anxious behaviors today as they have increased with current stressors with husband's pending medical testing. Patient having difficult time support husband as she gets focused on her own excessive worrying. Encouraged patient's use of anxiety exercises discussed previous and she finds this difficult to follow up on. Discussed her difficulty with this and role-played in session using some specific examples of situations to which patient responded  well. She is to continue practicing strategies "as homework" from today, in between sessions and give feedback on her success as well as her challenges in doing the homework.  Husband does have some pending health situations and upcoming tests about which patient is very worried and this is contributing to her overall stress as well.  Trying to keep her motivation but admits that her second-guessing of herself makes it more difficult.  Interventions: Cognitive Behavioral Therapy and Solution-Oriented/Positive Psychology   Long-term goal: Reduce overall level, frequency, and intensity of the anxiety so that daily functioning is not impaired. Short-term goal: Increase understanding of beliefs and messages that produce the worry and anxiety. Strategies: Explore cognitive messages that mediate anxiety response and retrain in adaptive cognitions.  Diagnosis:   ICD-10-CM   1. Generalized anxiety disorder  F41.1      Plan:  Patient in today showing good participation and motivation as she worked throughout the session on some goal-directed behaviors and more specifically in reference to some of her repetitive fearful and anxious thoughts and trying to interrupt them/challenge them to replace with more healthy and encouraging thoughts.  Also working to lower her anxiety level which today she reports "I have had this all my life".  Looking at what she can change and trying to focus more on that including some exercises we used in session to focus on specific changes she can try at home.  Is concerned about her husband's upcoming medical testing which is scheduled for first week of September.  Patient more motivated and seemed to put forth more effort today especially in practicing  certain strategies during the session.  Remains very concerned about her memory and plans to mention it again to her doctor, but in the meantime work with some of the strategies she did today in session is that also seem to help  motivate her more.  Talked about the need to honor her husband's wishes for "privacy and time alone at times" rather than always feeling she is got to be right there with him. Encouraged patient in practicing more positive behaviors including: Staying in the present and focus on what she can change versus cannot, keeping her expectations realistic and having patience with herself as she works on changes that are challenging, work on having moments of silence occasionally as needed throughout the day to give herself breaks and to meditate, recognize her improvement and reflect on it often, stay in touch with people who are supportive, get outside daily and walk, intentionally look for more positives versus negatives, refrain from assuming worst case scenarios, healthy nutrition and exercise, remain on her prescribed medication, practice consistent positive self talk, believe in herself and her ability to make tough changes, consider using some helpful books or podcasts, practice more intentional listening to others especially before responding, reduce overthinking and over analyzing, counteract self doubt, continue working on her perfectionistic tendencies, and recognize the strength she shows working with goal directed behaviors to move in a direction that supports her improved emotional health and overall wellbeing.  Goal review and progress/challenges noted with patient.  Next appointment within 3 weeks.  This record has been created using [employer].  Chart creation errors have been sought, but may not always have been located and corrected.  Such creation errors do not reflect on the standard of medical care provided.   Shanon Ace, LCSW

## 2021-07-29 ENCOUNTER — Ambulatory Visit: Payer: Medicare Other | Admitting: Psychiatry

## 2021-08-19 ENCOUNTER — Ambulatory Visit (INDEPENDENT_AMBULATORY_CARE_PROVIDER_SITE_OTHER): Payer: Medicare Other | Admitting: Psychiatry

## 2021-08-19 DIAGNOSIS — F411 Generalized anxiety disorder: Secondary | ICD-10-CM

## 2021-08-19 NOTE — Progress Notes (Signed)
[facility] Counselor/Therapist Progress Note  Patient ID: Tracey Norris, MRN: [id number],    Date: 08/19/2021  Time Spent: 50 minutes   Treatment Type: Individual Therapy  Reported Symptoms: anxiety, overtalking,  Mental Status Exam:  Appearance:   Casual     Behavior:  Appropriate and Sharing  Motor:  Normal  Speech/Language:   Clear and Coherent  Affect:  anxious  Mood:  anxious  Thought process:  goal directed  Thought content:    Rumination  Sensory/Perceptual disturbances:    WNL  Orientation:  oriented to person, place, time/date, situation, day of week, month of year, year, and stated date of August 19, 2021  Attention:  Good  Concentration:  Good and Fair  Memory:  "No significant issues"  Fund of knowledge:   Good  Insight:    Good and Fair  Judgment:   Good  Impulse Control:  Good and Fair   Risk Assessment: Danger to Self:  No Self-injurious Behavior: No Danger to Others: No Duty to Warn:no Physical Aggression / Violence:No  Access to Firearms a concern: No  Gang Involvement:No   Subjective: Patient in today reporting anxiety, fearful thought, worrying often, and some depression.  Concerned about her husband's upcoming gastrointestinal testing. Fears husband might have cancer. Anxiously processed her fearful thoughts in session today and talked more about her her concerns about her husband's testing to be done and how she struggles with a lot of anxious thoughts and "assumes" that they would end up being true.  Reviewed some of the work we have done previously regarding her anxious thinking and reviewed exercises done previously regarding staying more grounded "in the middle when we do not have more information" rather than assuming worst case scenarios.  Also discussed from our previous work about how "thoughts are just thoughts and the fact that we think them does not make them true", which patient seem to relate to and we discussed how she can use  this in her current situation and her excessive worrying.  Practice this a bit with some specific thoughts and fears she is having, which seemed helpful to patient.  Encouraged her to continue practicing this outside of sessions.  Second-guessing of herself still occurs but that does not seem quite as bad today.  Interventions: Cognitive Behavioral Therapy, Solution-Oriented/Positive Psychology, and Ego-Supportive  Long-term goal: Reduce overall level, frequency, and intensity of the anxiety so that daily functioning is not impaired. Short-term goal: Increase understanding of beliefs and messages that produce the worry and anxiety. Strategies: Explore cognitive messages that mediate anxiety response and retrain in adaptive cognitions.   Diagnosis:   ICD-10-CM   1. Generalized anxiety disorder  F41.1      Plan: Patient in today with good motivation and active participation in session working on her "automatic" anxious thoughts and worked with some specific examples related to her husband's upcoming testing, trying to not assume one extreme or the other and especially a negative extreme, but rather keeping herself in the middle realizing that she really does not know the outcome.  That does create uncertainty for her but the uncertainty is better than assuming worst case scenario.  We spent a good bit of time on that in session today and she seemed to be some better by session and but am concerned about her holding on to certain changes.  Encouraged patient in her practice of more positive behaviors including: Remaining in the present and focusing on what she can change, keeping her  expectations realistic and having patience with herself as she works on changes that are challenging, work on having moments of silence occasionally as needed throughout the day to give herself breaks and to meditate, stay in touch with people who are supportive, get outside daily and walk, intentionally look for more  positives versus negatives, refrain from assuming worst case scenarios, healthy nutrition and exercise, remain on her prescribed medication, practice consistent positive self talk, believe in herself and her ability to make tough changes, consider using some helpful books or podcast, practice more intentional listening to others especially before responding, reduce overthinking and over analyzing, counteract self doubt, continue working on her perfectionistic tendencies, and recognize the strength she shows working with goal directed behaviors to move in a direction that supports her improved emotional health.  Goal review and progress/challenges noted with patient.  Next appointment within 2 to 3 weeks.  This record has been created using [employer].  Chart creation errors have been sought, but may not always have been located and corrected.  Such creation errors do not reflect on the standard of medical care provided.   Shanon Ace, LCSW

## 2021-08-24 DIAGNOSIS — D510 Vitamin B12 deficiency anemia due to intrinsic factor deficiency: Secondary | ICD-10-CM | POA: Diagnosis not present

## 2021-09-09 ENCOUNTER — Ambulatory Visit (INDEPENDENT_AMBULATORY_CARE_PROVIDER_SITE_OTHER): Payer: Medicare Other | Admitting: Psychiatry

## 2021-09-09 DIAGNOSIS — F411 Generalized anxiety disorder: Secondary | ICD-10-CM | POA: Diagnosis not present
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
## 2021-09-16 DIAGNOSIS — Z853 Personal history of malignant neoplasm of breast: Secondary | ICD-10-CM | POA: Diagnosis not present

## 2021-09-16 DIAGNOSIS — G459 Transient cerebral ischemic attack, unspecified: Secondary | ICD-10-CM | POA: Diagnosis present

## 2021-09-16 DIAGNOSIS — F419 Anxiety disorder, unspecified: Secondary | ICD-10-CM | POA: Diagnosis present

## 2021-09-16 DIAGNOSIS — I10 Essential (primary) hypertension: Secondary | ICD-10-CM | POA: Diagnosis not present

## 2021-09-16 DIAGNOSIS — Q283 Other malformations of cerebral vessels: Secondary | ICD-10-CM | POA: Diagnosis not present

## 2021-09-16 DIAGNOSIS — Z7902 Long term (current) use of antithrombotics/antiplatelets: Secondary | ICD-10-CM | POA: Diagnosis not present

## 2021-09-16 DIAGNOSIS — I6523 Occlusion and stenosis of bilateral carotid arteries: Secondary | ICD-10-CM | POA: Diagnosis not present

## 2021-09-16 DIAGNOSIS — R0602 Shortness of breath: Secondary | ICD-10-CM | POA: Diagnosis not present

## 2021-09-16 DIAGNOSIS — R202 Paresthesia of skin: Secondary | ICD-10-CM | POA: Diagnosis not present

## 2021-09-16 DIAGNOSIS — R200 Anesthesia of skin: Secondary | ICD-10-CM | POA: Diagnosis not present

## 2021-09-16 DIAGNOSIS — E7800 Pure hypercholesterolemia, unspecified: Secondary | ICD-10-CM | POA: Diagnosis present

## 2021-09-16 DIAGNOSIS — R531 Weakness: Secondary | ICD-10-CM | POA: Diagnosis not present

## 2021-09-16 DIAGNOSIS — I6503 Occlusion and stenosis of bilateral vertebral arteries: Secondary | ICD-10-CM | POA: Diagnosis not present

## 2021-09-16 DIAGNOSIS — Z79899 Other long term (current) drug therapy: Secondary | ICD-10-CM | POA: Diagnosis not present

## 2021-09-16 DIAGNOSIS — Z0389 Encounter for observation for other suspected diseases and conditions ruled out: Secondary | ICD-10-CM | POA: Diagnosis not present

## 2021-09-16 DIAGNOSIS — I249 Acute ischemic heart disease, unspecified: Secondary | ICD-10-CM | POA: Diagnosis present

## 2021-09-16 DIAGNOSIS — I160 Hypertensive urgency: Secondary | ICD-10-CM | POA: Diagnosis present

## 2021-09-17 DIAGNOSIS — I249 Acute ischemic heart disease, unspecified: Secondary | ICD-10-CM | POA: Diagnosis not present

## 2021-09-17 DIAGNOSIS — I160 Hypertensive urgency: Secondary | ICD-10-CM | POA: Diagnosis not present

## 2021-09-17 DIAGNOSIS — G459 Transient cerebral ischemic attack, unspecified: Secondary | ICD-10-CM | POA: Diagnosis not present

## 2021-09-17 DIAGNOSIS — Z0389 Encounter for observation for other suspected diseases and conditions ruled out: Secondary | ICD-10-CM | POA: Diagnosis not present

## 2021-09-20 DIAGNOSIS — F419 Anxiety disorder, unspecified: Secondary | ICD-10-CM | POA: Diagnosis not present

## 2021-09-20 DIAGNOSIS — E559 Vitamin D deficiency, unspecified: Secondary | ICD-10-CM | POA: Diagnosis not present

## 2021-09-20 DIAGNOSIS — Z1231 Encounter for screening mammogram for malignant neoplasm of breast: Secondary | ICD-10-CM | POA: Diagnosis not present

## 2021-09-20 DIAGNOSIS — G459 Transient cerebral ischemic attack, unspecified: Secondary | ICD-10-CM | POA: Diagnosis not present

## 2021-09-20 DIAGNOSIS — I10 Essential (primary) hypertension: Secondary | ICD-10-CM | POA: Diagnosis not present

## 2021-09-20 DIAGNOSIS — E785 Hyperlipidemia, unspecified: Secondary | ICD-10-CM | POA: Diagnosis not present

## 2021-09-20 DIAGNOSIS — D510 Vitamin B12 deficiency anemia due to intrinsic factor deficiency: Secondary | ICD-10-CM | POA: Diagnosis not present

## 2021-09-29 ENCOUNTER — Ambulatory Visit: Payer: Medicare Other | Admitting: Psychiatry

## 2021-09-29 DIAGNOSIS — D510 Vitamin B12 deficiency anemia due to intrinsic factor deficiency: Secondary | ICD-10-CM | POA: Diagnosis not present

## 2021-10-15 DIAGNOSIS — E785 Hyperlipidemia, unspecified: Secondary | ICD-10-CM | POA: Diagnosis not present

## 2021-10-15 DIAGNOSIS — K219 Gastro-esophageal reflux disease without esophagitis: Secondary | ICD-10-CM | POA: Diagnosis not present

## 2021-10-15 DIAGNOSIS — K5909 Other constipation: Secondary | ICD-10-CM | POA: Diagnosis not present

## 2021-10-15 DIAGNOSIS — G459 Transient cerebral ischemic attack, unspecified: Secondary | ICD-10-CM | POA: Diagnosis not present

## 2021-10-15 DIAGNOSIS — I10 Essential (primary) hypertension: Secondary | ICD-10-CM | POA: Diagnosis not present

## 2021-10-15 DIAGNOSIS — Z23 Encounter for immunization: Secondary | ICD-10-CM | POA: Diagnosis not present

## 2021-10-21 ENCOUNTER — Ambulatory Visit (INDEPENDENT_AMBULATORY_CARE_PROVIDER_SITE_OTHER): Payer: Medicare Other | Admitting: Psychiatry

## 2021-10-21 DIAGNOSIS — F411 Generalized anxiety disorder: Secondary | ICD-10-CM | POA: Diagnosis not present
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
## 2021-11-02 DIAGNOSIS — D510 Vitamin B12 deficiency anemia due to intrinsic factor deficiency: Secondary | ICD-10-CM | POA: Diagnosis not present

## 2021-11-10 ENCOUNTER — Ambulatory Visit (INDEPENDENT_AMBULATORY_CARE_PROVIDER_SITE_OTHER): Payer: Medicare Other | Admitting: Psychiatry

## 2021-11-10 DIAGNOSIS — F411 Generalized anxiety disorder: Secondary | ICD-10-CM | POA: Diagnosis not present
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
## 2021-11-11 ENCOUNTER — Ambulatory Visit: Payer: Medicare Other | Admitting: Psychiatry

## 2021-11-30 ENCOUNTER — Ambulatory Visit: Payer: Medicare Other | Admitting: Psychiatry

## 2021-12-01 DIAGNOSIS — K219 Gastro-esophageal reflux disease without esophagitis: Secondary | ICD-10-CM | POA: Diagnosis not present

## 2021-12-01 DIAGNOSIS — K5901 Slow transit constipation: Secondary | ICD-10-CM | POA: Diagnosis not present

## 2021-12-07 DIAGNOSIS — D510 Vitamin B12 deficiency anemia due to intrinsic factor deficiency: Secondary | ICD-10-CM | POA: Diagnosis not present

## 2021-12-21 ENCOUNTER — Ambulatory Visit (INDEPENDENT_AMBULATORY_CARE_PROVIDER_SITE_OTHER): Payer: Medicare Other | Admitting: Psychiatry

## 2021-12-21 DIAGNOSIS — F411 Generalized anxiety disorder: Secondary | ICD-10-CM | POA: Diagnosis not present

## 2021-12-21 NOTE — Progress Notes (Signed)
[facility] Counselor/Therapist Progress Note  Patient ID: Tracey Norris, MRN: [id number],    Date: 12/21/2021  Time Spent: 50 minutes   Treatment Type: Individual Therapy  Reported Symptoms:  anxiety, "nervousness", some depression "but not daily", Denies any SI  Mental Status Exam:  Appearance:   Casual     Behavior:  Appropriate, Sharing, and Motivated  Motor:  Normal  Speech/Language:   Clear and Coherent  Affect:  anxious  Mood:  anxious and some depression  Thought process:  goal directed  Thought content:    Rumination  Sensory/Perceptual disturbances:    WNL  Orientation:  oriented to person, place, time/date, situation, day of week, month of year, year, and stated date of Dec. 12, 2023  Attention:  Good  Concentration:  Fair  Memory:  [place] of knowledge:   Good  Insight:    Good and Fair  Judgment:   Good  Impulse Control:  Good and Fair   Risk Assessment: Danger to Self:  No Self-injurious Behavior: No Danger to Others: No Duty to Warn:no Physical Aggression / Violence:No  Access to Firearms a concern: No  Gang Involvement:No   Subjective: Patient today reporting anxiety and some depression, and especially sensitive to what she perceives as criticism in her communication with others. Tending to repeat herself often and especially when more anxious. Processed today the type of thoughts that tends to accelerate her anxiety and also in some cases feeds depression although states depression "is not severe." Today talked through more of her anxious thoughts including: "I'm anxious about having another TIA", "I'm anxious about my husband's health", "I'm anxious about my sister having health issues (mental and physical", "I'm anxious about all I have to do before family Christmas gathering". Explored some of these anxious thoughts with patient as she worked on being able to manage them better and does see how they accentuate her anxiety. Was able to process  this more and practice some interruption of anxious thoughts gradually. Trying to work today with CBT on her over-talking and excessive worrying and patient did seem to gain some insight and admits "this is difficult because I have been this way all my life".  States that she does at times see herself doing some better.  To continue working on being more self-aware and trying to interrupt the over talking and excessive worrying, and try replacing with more reality based thoughts that do not lead to worrying.  Interventions: Cognitive Behavioral Therapy, Solution-Oriented/Positive Psychology, and Ego-Supportive  Long-term goal: Reduce overall level, frequency, and intensity of the anxiety so that daily functioning is not impaired. Short-term goal: Increase understanding of beliefs and messages that produce the worry and anxiety. Strategies: Explore cognitive messages that mediate anxiety response and retrain in adaptive cognitions.  Diagnosis:   ICD-10-CM   1. Generalized anxiety disorder  F41.1      Plan: Patient today actively working in session on anxiety and depression and especially some of the anxious thoughts that feeds both of these.  Seems to be showing some increased self-awareness of her anxious thoughts and use part of session today recognizing some that occur for her very often as well as some thought replacement with those.  He is to continue with some homework in between sessions and we will pick up on this next time.  Some increased insight.  More anxious than depressed.  Less self negating.  More supportive of herself and wanting to really make some changes that will  benefit her now and into her later years. Encouraged patient and her practice of positive behaviors as noted in session including: Staying in the present and focusing on what she can change, keeping expectations realistic and being patient with self to work on challenging changes in her life, looking for more positives  versus negatives, healthy nutrition and exercise, positive self talk, believing more in herself and her ability to make changes, practice more intentional listening to others before responding, reduce overthinking and over analyzing, and realize the strength she shows working with goal-directed behaviors to move in a direction that supports her improved emotional health.  Goal review and progress/challenges noted with patient.  Next appointment within 3 weeks.  This record has been created using [employer].  Chart creation errors have been sought, but may not always have been located and corrected.  Such creation errors do not reflect on the standard of medical care provided.   Shanon Ace, LCSW

## 2022-01-06 DIAGNOSIS — I10 Essential (primary) hypertension: Secondary | ICD-10-CM | POA: Diagnosis not present

## 2022-01-06 DIAGNOSIS — D510 Vitamin B12 deficiency anemia due to intrinsic factor deficiency: Secondary | ICD-10-CM | POA: Diagnosis not present

## 2022-01-06 DIAGNOSIS — E785 Hyperlipidemia, unspecified: Secondary | ICD-10-CM | POA: Diagnosis not present

## 2022-01-06 DIAGNOSIS — E559 Vitamin D deficiency, unspecified: Secondary | ICD-10-CM | POA: Diagnosis not present

## 2022-01-06 DIAGNOSIS — F419 Anxiety disorder, unspecified: Secondary | ICD-10-CM | POA: Diagnosis not present

## 2022-01-06 DIAGNOSIS — Z9181 History of falling: Secondary | ICD-10-CM | POA: Diagnosis not present

## 2022-01-11 ENCOUNTER — Ambulatory Visit (INDEPENDENT_AMBULATORY_CARE_PROVIDER_SITE_OTHER): Payer: Medicare Other | Admitting: Psychiatry

## 2022-01-11 DIAGNOSIS — F411 Generalized anxiety disorder: Secondary | ICD-10-CM

## 2022-01-11 NOTE — Progress Notes (Signed)
[facility] Counselor/Therapist Progress Note  Patient ID: Tracey Norris, MRN: [id number],    Date: 01/11/2022  Time Spent: 50 minutes   Treatment Type: Individual Therapy  Reported Symptoms: anxiety, excessive worrying, some depression "but not so much"  Mental Status Exam:  Appearance:   Casual and Neat     Behavior:  Appropriate, Sharing, and Motivated  Motor:  Normal  Speech/Language:   Clear and Coherent  Affect:  anxious  Mood:  anxious  Thought process:  goal directed  Thought content:    Rumination  Sensory/Perceptual disturbances:    WNL  Orientation:  oriented to person, place, time/date, situation, day of week, month of year, year, and stated date of Jan. 2, 2024  Attention:  Fair  Concentration:  Fair  Memory:  Greasy of knowledge:   Good  Insight:    Good and Fair  Judgment:   Good  Impulse Control:  Good   Risk Assessment: Danger to Self:  No Self-injurious Behavior: No Danger to Others: No Duty to Warn:no Physical Aggression / Violence:No  Access to Firearms a concern: No  Gang Involvement:No   Subjective:  Patient in today and reporting anxiety about some physical issues relating to stomach and bowel issues, uncertainty, family concerns, fears, and "I worry about something all the time and I make it (stressors) worse than it is." Worked with her worrying specifically today as it was more strong today.  Discussed how she could work on decreasing her worrying and negative assumptions and she was able to identify some of how her thoughts and behaviors actually feed her anxiety. Processed impact of her anxiety on family, including children and grandchildren. Struggles with perceiving comments from others as being more negative.Recognizing she does make frequent assumptions "like thinking family doesn't tell her much because I worry." Processed this more in session as it saddens patient. Patient is showing more effort in trying to interrupt her worrying  patterns and expressed it is difficult. "Worry about what others think of me." Discussed some ways of better recognizing her anxiety/worrying and be able to interrupt it, understand what it's about, and look at things more realistically rather than her habitual "worrying ways I have had for years, and it causes me physical symptoms and impacts family relationships." Feels "family doesn't want to be around me as much because of my worrying and anxiety." To work further at home between sessions with some anxiety and worry reduction as we did in session today. Also use of some CBT as in prior session. Difficult for patient due to long term habits being hard to change, but wants to feel better within family relationships.Practicing decreasing her over-talking which she reports is better at times.  Interventions: Cognitive Behavioral Therapy and Ego-Supportive  Long-term goal: Reduce overall level, frequency, and intensity of the anxiety so that daily functioning is not impaired. Short-term goal: Increase understanding of beliefs and messages that produce the worry and anxiety. Strategies: Explore cognitive messages that mediate anxiety response and retrain in adaptive cognitions.  Diagnosis:   ICD-10-CM   1. Generalized anxiety disorder  F41.1      Plan:  Patient today showing active participation in session and does seem to be showing more motivation and hope for herself and significantly decreasing her anxiety and worrying, although also very challenging for her.  She did work very diligently in session today and does find it difficult at times and family situations or with friends to practice the same mindset  she is working on having regarding the decrease of her worrying and anxiety.  Encouraged to work between sessions on thought interruption, grief focusing/reframing, and some breathing exercises as discussed in session.  Increased self-awareness.  Some improvement in insight.  Fewer negative  thoughts of herself. Encouraged patient in practicing positive behaviors as noted in session including: Remain in the present and focus on what she can change or control, keep expectations realistic and be patient with herself to work on challenging changes in her life, look for more positives versus negatives daily, healthy nutrition and exercise, positive self talk, believing more in herself and her ability to make changes, practice intentional listening to others before responding, reduce overthinking and over analyzing, and recognize the strength she shows working with goal-directed behaviors to move in a direction that supports her improved emotional health and outlook.  Goal review and progress/challenges noted with patient.  Next appointment within 3 weeks.  This record has been created using [employer].  Chart creation errors have been sought, but may not always have been located and corrected.  Such creation errors do not reflect on the standard of medical care provided.   Shanon Ace, LCSW

## 2022-02-01 ENCOUNTER — Ambulatory Visit (INDEPENDENT_AMBULATORY_CARE_PROVIDER_SITE_OTHER): Payer: Medicare Other | Admitting: Psychiatry

## 2022-02-01 DIAGNOSIS — F411 Generalized anxiety disorder: Secondary | ICD-10-CM | POA: Diagnosis not present

## 2022-02-01 NOTE — Progress Notes (Signed)
[facility] Counselor/Therapist Progress Note  Patient ID: Tracey Norris, MRN: [id number],    Date: 02/01/2022  Time Spent: 55 minutes   Treatment Type: Individual Therapy  Reported Symptoms: anxiety, some depression, worries about husband's health issues  Mental Status Exam:  Appearance:   Casual and Neat     Behavior:  Appropriate, Sharing, and Motivated  Motor:  Normal  Speech/Language:   Clear and Coherent  Affect:  Depressed and anxious  Mood:  anxious  Thought process:  goal directed  Thought content:    Rumination and obsessive thoughts  Sensory/Perceptual disturbances:    WNL  Orientation:  oriented to person, place, time/date, situation, day of week, month of year, year, and stated date of Feb 01, 2022  Attention:  Fair  Concentration:  Fair  Memory:  Some short term issues and Dr is aware  Fund of knowledge:   Good  Insight:    Good and Fair  Judgment:   Good  Impulse Control:  Good and Fair   Risk Assessment: Danger to Self:  No Self-injurious Behavior: No Danger to Others: No Duty to Warn:no Physical Aggression / Violence:No  Access to Firearms a concern: No  Gang Involvement:No   Subjective:  Patient in today reporting symptoms of anxiety, worrying, and some depression. Concerned for husband's health issues and her own health issues. Today is anxious but is showing some progress in managing her anxiety. Very gradual progress and husband confirmed this before our appt began today. Continues to have some stomach and bowel issues and being treated at [facility] practice for those issues, feels "maybe some progress gradually." These issues make her nervous about getting out and going places in public. Continued family concerns, fears, uncertainty, and worrying and she admits she does "sometimes make it worse than it is." Is trying to decrease her tendency to make situations worse than they are, and trying not to take things personally which she worked on both  of these today as they impact her anxiety. Assumes the negatives vs positives and we worked more with this in session today also, identifying some of her thoughts that feed her anxiety. Frequently mis-perceives things when talking with others. Is trying to interrupt anxious thoughts/talk and replace with more reality-based and calming thoughts. Worrying less about what others think of her. Shared and processed some about a newer family concern that is very sensitive and a family member is jailed currently. (Not all details included in this note due to patient privacy needs.) Patient continues working on her over-talking and supportive husband reports progress is being made.   Interventions: Cognitive Behavioral Therapy, Solution-Oriented/Positive Psychology, and Ego-Supportive  Long-term goal: Reduce overall level, frequency, and intensity of the anxiety so that daily functioning is not impaired. Short-term goal: Increase understanding of beliefs and messages that produce the worry and anxiety. Strategies: Explore cognitive messages that mediate anxiety response and retrain in adaptive cognitions.   Diagnosis:   ICD-10-CM   1. Generalized anxiety disorder  F41.1      Plan:  Patient today actively participating in session as she worked on anxiety, depression and her excessive worrying as note above. Mood some better today overall even in her dealing with very sensitive  family concerns. Ongoing efforts to decrease her anxious thoughts and to feel more confident, self-assured and is starting to see a little more progress in these areas, although progress is gradual and patient tends to struggle with patience.  Considering "older age and possible moving to  retirement community". To work further on some homework between sessions re: self-doubt, using deep breathing exercises as a tool with her anxiety and tension, and less self-negating. Some increase noted in self-awareness. Encouraged patient and  practicing to do behaviors as discussed in session including: Staying in the present and focusing on what she can control or change, keeping her expectations realistic and be patient with herself to work on challenging changes in her life, look for more positives versus negatives daily, healthy nutrition and exercise, positive self talk, believing more in herself and her ability to make changes, practice intentional listening to others before responding, reduce overthinking and over analyzing, and recognize the strength she shows working with goal-directed behaviors to move in a direction that supports her improved emotional health and outlook.  Goal review and progress/challenges noted with patient.  Next appointment within 3 weeks.  This record has been created using [employer].  Chart creation errors have been sought, but may not always have been located and corrected.  Such creation errors do not reflect on the standard of medical care provided.   Shanon Ace, LCSW

## 2022-02-01 NOTE — Progress Notes (Deleted)
[facility] Counselor/Therapist Progress Note  Patient ID: Tracey Norris, MRN: [id number],    Date: 02/01/2022  Time Spent: ***   Treatment Type: {[facility] AMB THERAPY [phone]}  Reported Symptoms: ***  Mental Status Exam:  Appearance:   {PSY:22683}     Behavior:  {PSY:[id number]}  Motor:  {PSY:22302}  Speech/Language:   {PSY:22685}  Affect:  {PSY:22687}  Mood:  {PSY:31886}  Thought process:  {PSY:31888}  Thought content:    {[phone]}  Sensory/Perceptual disturbances:    {PSY:[phone]}  Orientation:  {PSY:30297}  Attention:  {PSY:22877}  Concentration:  {PSY:939-341-4907}  Memory:  {PSY:581-365-4138}  Fund of knowledge:   {PSY:939-341-4907}  Insight:    {PSY:939-341-4907}  Judgment:   {PSY:939-341-4907}  Impulse Control:  {PSY:[id number]}   Risk Assessment: Danger to Self:  {PSY:22692} Self-injurious Behavior: {PSY:22692} Danger to Others: {PSY:22692} Duty to Warn:{PSY:311194} Physical Aggression / Violence:{PSY:21197} Access to Firearms a concern: {PSY:21197} Gang Involvement:{PSY:21197}  Subjective: ***       Patient in today reporting symptoms anxiety       about some physical issues relating to stomach and bowel issues, uncertainty, family concerns, fears, and "I worry about something all the time and I make it (stressors) worse than it is." Worked with her worrying specifically today as it was more strong today.  Discussed how she could work on decreasing her worrying and negative assumptions and she was able to identify some of how her thoughts and behaviors actually feed her anxiety. Processed impact of her anxiety on family, including children and grandchildren. Struggles with perceiving comments from others as being more negative.Recognizing she does make frequent assumptions "like thinking family doesn't tell her much because I worry." Processed this more in session as it saddens patient. Patient is showing more effort in trying to interrupt her  worrying patterns and expressed it is difficult. "Worry about what others think of me." Discussed some ways of better recognizing her anxiety/worrying and be able to interrupt it, understand what it's about, and look at things more realistically rather than her habitual "worrying ways I have had for years, and it causes me physical symptoms and impacts family relationships." Feels "family doesn't want to be around me as much because of my worrying and anxiety." To work further at home between sessions with some anxiety and worry reduction as we did in session today. Also use of some CBT as in prior session. Difficult for patient due to long term habits being hard to change, but wants to feel better within family relationships.Practicing decreasing her over-talking which she reports is better at time     Interventions: {[phone]}  Long-term goal: Reduce overall level, frequency, and intensity of the anxiety so that daily functioning is not impaired. Short-term goal: Increase understanding of beliefs and messages that produce the worry and anxiety. Strategies: Explore cognitive messages that mediate anxiety response and retrain in adaptive cognitions.   Diagnosis:No diagnosis found.  Plan: ***Patient today actively participating in session as she worked on     Patient today showing active participation in session and does seem to be showing more motivation and hope for herself and significantly decreasing her anxiety and worrying, although also very challenging for her.  She did work very diligently in session today and does find it difficult at times and family situations or with friends to practice the same mindset she is working on having regarding the decrease of her worrying and anxiety.  Encouraged to work between sessions on thought interruption, grief focusing/reframing, and some breathing  exercises as discussed in session.  Increased self-awareness.  Some improvement in insight.   Fewer negative thoughts of herself.    ///////////////////////////////////////////////////////////////////////////////////////////////////////////////////////////////  Encouraged patient and practicing to do behaviors as discussed in session including: Staying in the present and focusing on what she can control or change, keeping her expectations realistic and be patient with herself to work on challenging changes in her life, look for more positives versus negatives daily, healthy nutrition and exercise, positive self talk, believing more in herself and her ability to make changes, practice intentional listening to others before responding, reduce overthinking and over analyzing, and recognize the strength she shows working with goal-directed behaviors to move in a direction that supports her improved emotional health and outlook.  Goal review and progress/challenges noted with patient.  Next appointment within 3 weeks.  This record has been created using [employer].  Chart creation errors have been sought, but may not always have been located and corrected.  Such creation errors do not reflect on the standard of medical care provided.   Shanon Ace, LCSW

## 2022-02-08 DIAGNOSIS — D510 Vitamin B12 deficiency anemia due to intrinsic factor deficiency: Secondary | ICD-10-CM | POA: Diagnosis not present

## 2022-02-22 ENCOUNTER — Ambulatory Visit (INDEPENDENT_AMBULATORY_CARE_PROVIDER_SITE_OTHER): Payer: Medicare Other | Admitting: Psychiatry

## 2022-02-22 DIAGNOSIS — F411 Generalized anxiety disorder: Secondary | ICD-10-CM

## 2022-02-22 NOTE — Progress Notes (Signed)
[facility] Counselor/Therapist Progress Note  Patient ID: Tracey Norris, MRN: [id number],    Date: 02/22/2022  Time Spent: 55 minutes   Treatment Type: Individual Therapy  Reported Symptoms: anxiety, worrying, depression decreased  Mental Status Exam:  Appearance:   Casual and Well Groomed     Behavior:  Appropriate, Sharing, and Motivated  Motor:  Normal  Speech/Language:   Clear and Coherent  Affect:  anxious  Mood:  anxious  Thought process:  goal directed  Thought content:    Rumination and some obsessive thoughts at times  Sensory/Perceptual disturbances:    WNL  Orientation:  oriented to person, place, time/date, situation, day of week, month of year, year, and stated date of Feb. 13, 2024  Attention:  Fair  Concentration:  Good and Fair  Memory:  Some short term memory issues  Fund of knowledge:   Good  Insight:    Good and Fair  Judgment:   Good  Impulse Control:  Good   Risk Assessment: Danger to Self:  No Self-injurious Behavior: No Danger to Others: No Duty to Warn:no Physical Aggression / Violence:No  Access to Firearms a concern: No  Gang Involvement:No   Subjective:  Patient in today and reporting anxiety, and worrying. Depression decreased. Today worked on her stress and anxiety related to family and especially to husband's physical health issues which patient tends to "worry over". Husband more stressed and patient less tolerant at times, adding to patient's distress. Worked today on some communication skills to help her within her marriage and in other relationships. Focused more on her verbal communication and "body language" as these have been more problematic recently in marital and family relationships. Needing part of session today to process concerns about her older sister in long term care facility. Too frequently assumes the negatives, although continues to work on interrupting this pattern. Better identifying her triggers to her anxiety.  No further development in situation with family member being jailed and to be tried in court. Patient shares her concerns on this today.  Interventions: Cognitive Behavioral Therapy and Ego-Supportive  Long-term goal: Reduce overall level, frequency, and intensity of the anxiety so that daily functioning is not impaired. Short-term goal: Increase understanding of beliefs and messages that produce the worry and anxiety. Strategies: Explore cognitive messages that mediate anxiety response and retrain in adaptive cognitions.   Diagnosis:   ICD-10-CM   1. Generalized anxiety disorder  F41.1      Plan:  Patient today showing good motivation and actively participated in session focusing on her excessive worrying and anxiety. Is making some progress and family members have told her they have noticed improvements.  Is catching herself more in excessive worrying or looking for what might go wrong versus right, and trying to correct that as she tries to lean in a more positive direction.  Due to her long history of focusing more on her anxiousness, worrying, and the negatives, this is very difficult for patient but she is showing good effort more consistently.  Mood overall some better.  Continues to deal with very sensitive family concerns.  Wants to feel more confident and self-assured and is starting to notice this at least on some occasions.  Has fears about the future as she gets older but was able to stay in the present today.  To continue work with homework in between sessions related to her goals and needs to continue goal-directed behaviors in order keep moving forward in a more positive direction.  Self-awareness is also increasing. Encouraged patient in her practice of positive behaviors as noted in session including: Staying in the present and focusing on what she can change her control, look for more positives versus negatives each day, keep her expectations realistic and be patient with herself  to work on challenging changes that are needed in her life, healthy nutrition and exercise, positive self talk, believing more in herself and her ability to make changes, practice intentional listening to others before responding, reduce overthinking and over analyzing, and realize the strength she shows working with goal-directed behaviors to move in a direction that supports her improved emotional health and overall wellbeing.  Goal review and progress/challenges noted with patient.  Next appointment within 3 to 4 weeks.  This record has been created using [employer].  Chart creation errors have been sought, but may not always have been located and corrected.  Such creation errors do not reflect on the standard of medical care provided.   Shanon Ace, LCSW

## 2022-02-22 NOTE — Progress Notes (Deleted)
[facility] Counselor/Therapist Progress Note  Patient ID: Tracey Norris, MRN: [id number],    Date: 02/22/2022  Time Spent: 50 minutes   Treatment Type: Individual Therapy  Reported Symptoms: anxiety, worrying  Mental Status Exam:  Appearance:   Casual and Well Groomed     Behavior:  Appropriate, Sharing, and Motivated  Motor:  Normal  Speech/Language:   Clear and Coherent  Affect:  anxious  Mood:  anxious  Thought process:  goal directed  Thought content:    Rumination and some obsessive thoughts at times  Sensory/Perceptual disturbances:    WNL  Orientation:  oriented to person, place, time/date, situation, day of week, month of year, year, and stated date of Feb. 13, 2024  Attention:  {PSY:22877}  Concentration:  {PSY:434-355-8350}  Memory:  [id number]  Fund of knowledge:   {PSY:434-355-8350}  Insight:    {PSY:434-355-8350}  Judgment:   {PSY:434-355-8350}  Impulse Control:  {PSY:434-355-8350}   Risk Assessment: Danger to Self:  {PSY:22692} Self-injurious Behavior: {PSY:22692} Danger to Others: {PSY:22692} Duty to Warn:{PSY:311194} Physical Aggression / Violence:{PSY:21197} Access to Firearms a concern: {PSY:21197} Gang Involvement:{PSY:21197}  Subjective: ***   Patient in today and reporting anxiety, some depression, and worrying     Concerned for husband's health issues and her own health issues. Today is anxious but is showing some progress in managing her anxiety. Very gradual progress and husband confirmed this before our appt began today. Continues to have some stomach and bowel issues and being treated at [facility] practice for those issues, feels "maybe some progress gradually." These issues make her nervous about getting out and going places in public. Continued family concerns, fears, uncertainty, and worrying and she admits she does "sometimes make it worse than it is." Is trying to decrease her tendency to make situations worse than they are, and trying  not to take things personally which she worked on both of these today as they impact her anxiety. Assumes the negatives vs positives and we worked more with this in session today also, identifying some of her thoughts that feed her anxiety. Frequently mis-perceives things when talking with others. Is trying to interrupt anxious thoughts/talk and replace with more reality-based and calming thoughts. Worrying less about what others think of her. Shared and processed some about a newer family concern that is very sensitive and a family member is jailed currently. (Not all details included in this note due to patient privacy needs.) Patient continues working on her over-talking and supportive husband reports progress is being made.     Interventions: {[phone]}  Long-term goal: Reduce overall level, frequency, and intensity of the anxiety so that daily functioning is not impaired. Short-term goal: Increase understanding of beliefs and messages that produce the worry and anxiety. Strategies: Explore cognitive messages that mediate anxiety response and retrain in adaptive cognitions.   Diagnosis:   ICD-10-CM   1. Generalized anxiety disorder  F41.1       Plan: ***   Patient today showing good motivation and actively participated in session focusing on   Patient today actively participating in session as she worked on anxiety, depression and her excessive worrying as note above. Mood some better today overall even in her dealing with very sensitive  family concerns. Ongoing efforts to decrease her anxious thoughts and to feel more confident, self-assured and is starting to see a little more progress in these areas, although progress is gradual and patient tends to struggle with patience.  Considering "older age and possible moving to  retirement community". To work further on some homework between sessions re: self-doubt, using deep breathing exercises as a tool with her anxiety and tension, and  less self-negating. Some increase noted in self-awareness.     /////////////////////////////////////////////////////////////////////////////////  Encouraged patient in her practice of positive behaviors as noted in session including: Staying in the present and focusing on what she can change her control, look for more positives versus negatives each day, keep her expectations realistic and be patient with herself to work on challenging changes that are needed in her life, healthy nutrition and exercise, positive self talk, believing more in herself and her ability to make changes, practice intentional listening to others before responding, reduce overthinking and over analyzing, and realize the strength she shows working with goal-directed behaviors to move in a direction that supports her improved emotional health and overall wellbeing.  Goal review and progress/challenges noted with patient.  Next appointment within 3 to 4 weeks.  This record has been created using [employer].  Chart creation errors have been sought, but may not always have been located and corrected.  Such creation errors do not reflect on the standard of medical care provided.   Shanon Ace, LCSW

## 2022-03-10 DIAGNOSIS — D519 Vitamin B12 deficiency anemia, unspecified: Secondary | ICD-10-CM | POA: Diagnosis not present

## 2022-03-15 ENCOUNTER — Ambulatory Visit (INDEPENDENT_AMBULATORY_CARE_PROVIDER_SITE_OTHER): Payer: Medicare Other | Admitting: Psychiatry

## 2022-03-15 DIAGNOSIS — F411 Generalized anxiety disorder: Secondary | ICD-10-CM

## 2022-03-15 NOTE — Progress Notes (Deleted)
      [facility] Counselor/Therapist Progress Note  Patient ID: Tracey Norris, MRN: [id number],    Date: 03/15/2022  Time Spent: ***   Treatment Type: {[facility] AMB THERAPY TYPES:[id number]}  Reported Symptoms: ***  Mental Status Exam:  Appearance:   {PSY:22683}     Behavior:  {PSY:21022743}  Motor:  {PSY:22302}  Speech/Language:   {PSY:22685}  Affect:  {PSY:22687}  Mood:  {PSY:31886}  Thought process:  {PSY:31888}  Thought content:    {PSY:478 028 4233}  Sensory/Perceptual disturbances:    {[phone]}  Orientation:  {PSY:30297}  Attention:  {PSY:22877}  Concentration:  {PSY:629-628-8853}  Memory:  {[phone]}  Fund of knowledge:   {PSY:629-628-8853}  Insight:    {PSY:629-628-8853}  Judgment:   {PSY:629-628-8853}  Impulse Control:  {PSY:629-628-8853}   Risk Assessment: Danger to Self:  {PSY:22692} Self-injurious Behavior: {PSY:22692} Danger to Others: {PSY:22692} Duty to Warn:{PSY:311194} Physical Aggression / Violence:{PSY:21197} Access to Firearms a concern: {PSY:21197} Gang Involvement:{PSY:21197}  Subjective: ***         Interventions: {PSY:845-273-5484}        Diagnosis:No diagnosis found.  Plan: ***         Shanon Ace, LCSW

## 2022-03-15 NOTE — Progress Notes (Signed)
[facility] Counselor/Therapist Progress Note  Patient ID: Tracey Norris, MRN: [id number],    Date: 03/15/2022  Time Spent: 48 minutes   Treatment Type: Individual Therapy  Virtual Visit via Telehealth Note: Telephone session as patient not able to do the Video Connected with patient by a telemedicine/telehealth application, with their informed consent, and verified patient privacy and that I am speaking with the correct person using two identifiers. I discussed the limitations, risks, security and privacy concerns of performing psychotherapy and the availability of in person appointments. I also discussed with the patient that there may be a patient responsible charge related to this service. The patient expressed understanding and agreed to proceed. I discussed the treatment planning with the patient. The patient was provided an opportunity to ask questions and all were answered. The patient agreed with the plan and demonstrated an understanding of the instructions. The patient was advised to call  our office if  symptoms worsen or feel they are in a crisis state and need immediate contact.   Therapist Location: office Patient Location: home   Reported Symptoms:  anxiety  Mental Status Exam:  Appearance:   N/a   telehealth      Behavior:  N/a  telehealth  Motor:  Normal  Speech/Language:   Clear and Coherent  Affect:  N/a  telehealth  Mood:  anxious  Thought process:  goal directed  Thought content:    Rumination and some obsessive thoughts  Sensory/Perceptual disturbances:    WNL  Orientation:  oriented to person, place, time/date, situation, day of week, month of year, year, and stated date of March 15, 2022  Attention:  Fair  Concentration:  Fair  Memory:  Reports some short term memory issues occasionally  Fund of knowledge:   Good  Insight:    Good and Fair  Judgment:   Good  Impulse Control:  Good   Risk Assessment: Danger to Self:  No Self-injurious Behavior:  No Danger to Others: No Duty to Warn:no Physical Aggression / Violence:No  Access to Firearms a concern: No  Gang Involvement:No   Subjective:  Patient today reporting symptoms of anxiety, worrying, but does feel she is progressing.  Reports her depression has continued to be decreased.  Husband had better reports from some medical testing recently.  Adds that she "is not worse and they have been dealing with multiple stressors" so she sees that as a "good sign". Trying not to "second-guess" herself and her abilities to continue to her focus on her goals. States "I want to be able to get out more and go places, some that we've been to before, but want to be more comfortable out in public." Has had some physical issues recently that have led to her being home more, and discussed this in more detail in session. (Not all details included in this note due to patient privacy needs.)  Overall, some decrease in her anxiety is noted and husband able to back this up.  Continues to better identify triggers to her anxiety more quickly.  Remains concerned about an older sister in a long-term care facility with significant memory issues.  Patient wanting to be supportive and yet have some healthy boundaries as this relationship tends to heighten patient's fears of having memory issues and her future. Reviewed strategies with patient regarding her better managing her anxiety, using specific examples which are very current for her right now.  Interventions: Cognitive Behavioral Therapy and Ego-Supportive  Long-term goal: Reduce overall level,  frequency, and intensity of the anxiety so that daily functioning is not impaired. Short-term goal: Increase understanding of beliefs and messages that produce the worry and anxiety. Strategies: Explore cognitive messages that mediate anxiety response and retrain in adaptive cognitions.   Diagnosis:   ICD-10-CM   1. Generalized anxiety disorder  F41.1      Plan:    Patient today actively participating in session and focusing more on her anxiety, worrying, and trying not to assume "worst-case scenarios".  She is definitely making progress and husband notices this as well.  Patient to continue with goal-directed behaviors and strategies in order to keep moving forward in a positive direction.  Has a good attitude and is practicing more positive self-care and positive self talk.  Other family members have told patient that they also are seeing improvements with her.  Able to interrupt some of her excessive worrying at times but this is still a work in progress.  Increased self-awareness and this also is a work in progress.  There are some family situations that are very "sensitive and private" and patient has been able to deal with them and not be overly anxious. Encouraged patient and practicing more positive/self affirming behaviors noted in session including: Staying in the present and focusing on what she can change or control, look for more positives versus negatives daily, believing more in herself and her ability to make changes, practice intentional listening to others before responding or interrupting them, reduce her overthinking and over analyzing, keep her expectations realistic and be patient with herself as she works on challenging changes that are needed in her life, and recognize the strength she shows working with goal-directed behaviors to move in a direction that supports her improved emotional health and overall wellbeing.  Goal review and progress/challenges noted with patient.  Next appointment within 3 weeks.  This record has been created using [employer].  Chart creation errors have been sought, but may not always have been located and corrected.  Such creation errors do not reflect on the standard of medical care provided.   Shanon Ace, LCSW

## 2022-03-15 NOTE — Progress Notes (Deleted)
      [facility] Counselor/Therapist Progress Note  Patient ID: Tracey Norris, MRN: [id number],    Date: 03/15/2022  Time Spent: ***   Treatment Type: {[facility] AMB THERAPY TYPES:[id number]}  Reported Symptoms: ***  Mental Status Exam:  Appearance:   {PSY:22683}     Behavior:  {PSY:21022743}  Motor:  {PSY:22302}  Speech/Language:   {PSY:22685}  Affect:  {PSY:22687}  Mood:  {PSY:31886}  Thought process:  {PSY:31888}  Thought content:    {PSY:860 805 7494}  Sensory/Perceptual disturbances:    {PSY:831-850-6002}  Orientation:  {PSY:30297}  Attention:  {PSY:22877}  Concentration:  {PSY:9125959344}  Memory:  {[phone] 475-1289}  Fund of knowledge:   {PSY:9125959344}  Insight:    {PSY:9125959344}  Judgment:   {PSY:9125959344}  Impulse Control:  {PSY:9125959344}   Risk Assessment: Danger to Self:  {PSY:22692} Self-injurious Behavior: {PSY:22692} Danger to Others: {PSY:22692} Duty to Warn:{PSY:311194} Physical Aggression / Violence:{PSY:21197} Access to Firearms a concern: {PSY:21197} Gang Involvement:{PSY:21197}  Subjective: ***         Interventions: {PSY:236-601-9871}        Diagnosis:   ICD-10-CM   1. Generalized anxiety disorder  F41.1       Plan: ***         Shanon Ace, LCSW

## 2022-03-21 DIAGNOSIS — K219 Gastro-esophageal reflux disease without esophagitis: Secondary | ICD-10-CM | POA: Diagnosis not present

## 2022-03-21 DIAGNOSIS — K5901 Slow transit constipation: Secondary | ICD-10-CM | POA: Diagnosis not present

## 2022-03-21 DIAGNOSIS — R110 Nausea: Secondary | ICD-10-CM | POA: Diagnosis not present

## 2022-03-22 DIAGNOSIS — H04123 Dry eye syndrome of bilateral lacrimal glands: Secondary | ICD-10-CM | POA: Diagnosis not present

## 2022-04-05 ENCOUNTER — Ambulatory Visit (INDEPENDENT_AMBULATORY_CARE_PROVIDER_SITE_OTHER): Payer: Medicare Other | Admitting: Psychiatry

## 2022-04-05 DIAGNOSIS — F411 Generalized anxiety disorder: Secondary | ICD-10-CM | POA: Diagnosis not present

## 2022-04-05 NOTE — Progress Notes (Signed)
[facility] Counselor/Therapist Progress Note  Patient ID: Dian Grosman, MRN: [id number],    Date: 04/05/2022  Time Spent: 55 minutes   Virtual Visit via Telehealth Note: Telephone session as patient not able to do the video Connected with patient by a telemedicine/telehealth application, with their informed consent, and verified patient privacy and that I am speaking with the correct person using two identifiers. I discussed the limitations, risks, security and privacy concerns of performing psychotherapy and the availability of in person appointments. I also discussed with the patient that there may be a patient responsible charge related to this service. The patient expressed understanding and agreed to proceed. I discussed the treatment planning with the patient. The patient was provided an opportunity to ask questions and all were answered. The patient agreed with the plan and demonstrated an understanding of the instructions. The patient was advised to call  our office if  symptoms worsen or feel they are in a crisis state and need immediate contact.   Therapist Location: office Patient Location: home   Treatment Type: Individual Therapy  Reported Symptoms: anxiety  Mental Status Exam:  Appearance:   N/a  telehealth      Behavior:  Sharing and Motivated  Motor:  Normal  Speech/Language:   Clear and Coherent  Affect:  N/a  telehealth  Mood:  anxious, depressed, irritable, and sad  Thought process:  goal directed  Thought content:    Rumination and some obsessive thoughts  Sensory/Perceptual disturbances:    WNL  Orientation:  oriented to person, place, time/date, situation, day of week, month of year, year, and stated date of April 05, 2022  Attention:  Fair  Concentration:  Fair  Memory:  Some short term memory issues and Dr is aware  Fund of knowledge:   Good  Insight:    Good and Fair  Judgment:   Good  Impulse Control:  Good and Fair   Risk  Assessment: Danger to Self:  No Self-injurious Behavior: No Danger to Others: No Duty to Warn:no Physical Aggression / Violence:No  Access to Firearms a concern: No  Gang Involvement:No   Subjective:  Patient today reporting anxiety, sadness (re: brother's recent death), worrying, and reports she is working on her symptom-related goals with some gradual progress noted. Notes that she and husband are getting "noticeably older and can't do everything we used to do ad have more health concern". "I feel like we've aged the past five years especially." Don't get to do some of the things we used to do more often, but do try to participate as much as possible in the things we enjoy, especially family. States she feels "it helps her to talk through these issues as a lot of her friends and some family are also aging and may not understand as well, and have their own issues." "A mixture of different issues and a lot I can't do anything about, but keep working on myself." Does seem to be more goal-directed this session, and following up making efforts with behaviors that can help her emotionally manage stressors more effectively, but "my steps are more like baby steps."  Encouraged her even in small steps of progress.  Interventions: Cognitive Behavioral Therapy and Ego-Supportive  Long-term goal: Reduce overall level, frequency, and intensity of the anxiety so that daily functioning is not impaired. Short-term goal: Increase understanding of beliefs and messages that produce the worry and anxiety. Strategies: Explore cognitive messages that mediate anxiety response and retrain in adaptive  cognitions.  Diagnosis:   ICD-10-CM   1. Generalized anxiety disorder  F41.1      Plan: Patient today showing good motivation and participation in session as she focused more on her anxiety, worrying, and sadness especially related to her brothers death and another situation in the family that is very serious.  (Not  all details included in this note due to patient privacy needs.)  Did state that she felt more calm after venting and sharing a lot of information as well as step she is trying to take towards her goals  Patient is making progress and needs to continue work with goal-directed behaviors to keep moving in a positive direction.  Seem to be showing more active participation today and acknowledging things that were tough to acknowledge versus avoiding them.  Felt some decrease in her sadness and knows that her grief does take time to work through and she is doing that.  Other family members continue to share with patient some of the progress that they see in her thoughts and behaviors, which is affirming to patient.  Self-awareness is increased. Encouraged patient in her practice of more positive/self affirming behaviors as noted in session including: Feel good about some of the progress she is already making, looking for more positives versus negatives each day, stay in the present and focus on what she can change or control, believe more in herself and her ability to make significant changes, practice intentional listening to others before responding or interrupting them, reduce her overthinking and over analyzing, keep her expectations realistic and be patient with herself as she works on difficult changes that are needed in her life, and recognize the strengths she shows with goal-directed behaviors to move in a direction that supports her improved emotional health and outlook.  Goal review and progress/challenges noted with patient.  Next appointment within 2-3 weeks.  This record has been created using [employer].  Chart creation errors have been sought, but may not always have been located and corrected.  Such creation errors do not reflect on the standard of medical care provided.   Shanon Ace, LCSW

## 2022-04-07 DIAGNOSIS — D510 Vitamin B12 deficiency anemia due to intrinsic factor deficiency: Secondary | ICD-10-CM | POA: Diagnosis not present

## 2022-04-07 DIAGNOSIS — F419 Anxiety disorder, unspecified: Secondary | ICD-10-CM | POA: Diagnosis not present

## 2022-04-07 DIAGNOSIS — E785 Hyperlipidemia, unspecified: Secondary | ICD-10-CM | POA: Diagnosis not present

## 2022-04-07 DIAGNOSIS — I10 Essential (primary) hypertension: Secondary | ICD-10-CM | POA: Diagnosis not present

## 2022-04-07 DIAGNOSIS — E559 Vitamin D deficiency, unspecified: Secondary | ICD-10-CM | POA: Diagnosis not present

## 2022-04-12 DIAGNOSIS — D519 Vitamin B12 deficiency anemia, unspecified: Secondary | ICD-10-CM | POA: Diagnosis not present

## 2022-04-26 ENCOUNTER — Encounter: Payer: Self-pay | Admitting: Psychiatry

## 2022-04-26 ENCOUNTER — Ambulatory Visit (INDEPENDENT_AMBULATORY_CARE_PROVIDER_SITE_OTHER): Payer: Medicare Other | Admitting: Psychiatry

## 2022-04-26 DIAGNOSIS — F411 Generalized anxiety disorder: Secondary | ICD-10-CM

## 2022-04-26 NOTE — Progress Notes (Signed)
[facility] Counselor/Therapist Progress Note  Patient ID: Tracey Norris, MRN: [id number],    Date: 04/26/2022  Time Spent: 50 minutes  Treatment Type: Individual Therapy  Virtual Visit via Telehealth Note:  MyChartVideo Connected with patient by a telemedicine/telehealth application, with their informed consent, and verified patient privacy and that I am speaking with the correct person using two identifiers. I discussed the limitations, risks, security and privacy concerns of performing psychotherapy and the availability of in person appointments. I also discussed with the patient that there may be a patient responsible charge related to this service. The patient expressed understanding and agreed to proceed. I discussed the treatment planning with the patient. The patient was provided an opportunity to ask questions and all were answered. The patient agreed with the plan and demonstrated an understanding of the instructions. The patient was advised to call  our office if  symptoms worsen or feel they are in a crisis state and need immediate contact.   Therapist Location: office Patient Location: home   Reported Symptoms:  anxiety, worry and "freak out" about stressful things and situations especially when it  is about things I don't understand"   Mental Status Exam:  Appearance:   Casual     Behavior:  Appropriate, Sharing, and Motivated  Motor:  Normal  Speech/Language:   Clear and Coherent  Affect:  anxious  Mood:  anxious  Thought process:  goal directed  Thought content:    Obsessive thoughts  Sensory/Perceptual disturbances:    WNL  Orientation:  oriented to person, place, time/date, situation, day of week, month of year, year, and stated date of April 26, 2022  Attention:  Fair  Concentration:  Good and Fair  Memory:  Some short term memory issues and Dr is aware  Fund of knowledge:   Good and Fair  Insight:    Good and Fair  Judgment:   Good  Impulse Control:   Good and Fair   Risk Assessment: Danger to Self:  No Self-injurious Behavior: No Danger to Others: No Duty to Warn:no Physical Aggression / Violence:No  Access to Firearms a concern: No  Gang Involvement:No   Subjective:   Patient today reporting anxiety, worry, and "freaking out" especially about things I don't understand. Technology advances, she reports really "messes with me because I don't understand it all". Had difficulty today at start of session and then again where there were some tech issues. But did well in continuing her focus especially as related to treatment goals. Stressed with some physical issues that are causing patient to "stay home more because of not being comfortable getting out quite as much", although does realize the need to not isolate at home quite as much. Worked on some self-calming skills in session today as that has helped patient emotionally, interpersonally, and physically in the past but forgets to practice the calming skills regularly. "I know it's hard to stay calm in my head and not worry so much."  Next session to be in-person and not telehealth per therapist recommendation and  patient agrees.  Hard to focus at times but is showing more effort and being able to shift and get back on task with goal-directed discussions.  Especially focusing on thoughts and behaviors that can help her emotionally manage her stressors more effectively.  Interventions: Cognitive Behavioral Therapy and Ego-Supportive  Long-term goal: Reduce overall level, frequency, and intensity of the anxiety so that daily functioning is not impaired. Short-term goal: Increase understanding of beliefs  and messages that produce the worry and anxiety. Strategies: Explore cognitive messages that mediate anxiety response and retrain in adaptive cognitions.   Diagnosis:   ICD-10-CM   1. Generalized anxiety disorder  F41.1      Plan:   Patient today participating well in session as she  worked further on her anxiety and worrying, and looking more intentionally at what helps and what does not help in working with these behaviors.  Patient does show good effort and has some legitimate challenges emotionally, and seem to make more gains towards end of session today.  Reports previous sadness has decreased a lot.  Therapist did ask that the next session be in person and not telehealth as that seems to be better for this patient.  Self-awareness improving.  Patient agreed to this.  Does work hard in sessions and shows some progress.  Needs to continue her work with goal-directed behaviors to keep moving in a forward direction. Encouraged patient in practicing more positive/self affirming behaviors as noted in session including: Feel encouraged about some of the progress she is already making, looking for more positives versus negatives each day, stay in the present and focus on what she can change or control, believe more in herself and her ability to make significant changes, practice intentional listening to others before responding or interrupting them, reduce her overthinking and over analyzing, keep her expectations realistic and be patient with herself as she works on difficult changes that are needed in her life, and recognize the strength she shows with goal-directed behaviors to move in a direction that supports her improved emotional health and overall wellbeing.  Goal review and progress/challenges noted with patient.  Next appointment within 3 to 4 weeks.  This record has been created using [employer].  Chart creation errors have been sought, but may not always have been located and corrected.  Such creation errors do not reflect on the standard of medical care provided.   Mathis Fare, LCSW

## 2022-05-05 DIAGNOSIS — K219 Gastro-esophageal reflux disease without esophagitis: Secondary | ICD-10-CM | POA: Diagnosis not present

## 2022-05-05 DIAGNOSIS — K5901 Slow transit constipation: Secondary | ICD-10-CM | POA: Diagnosis not present

## 2022-05-12 DIAGNOSIS — D510 Vitamin B12 deficiency anemia due to intrinsic factor deficiency: Secondary | ICD-10-CM | POA: Diagnosis not present

## 2022-05-16 ENCOUNTER — Ambulatory Visit (INDEPENDENT_AMBULATORY_CARE_PROVIDER_SITE_OTHER): Payer: Medicare Other | Admitting: Psychiatry

## 2022-05-16 DIAGNOSIS — F411 Generalized anxiety disorder: Secondary | ICD-10-CM | POA: Diagnosis not present

## 2022-05-16 NOTE — Progress Notes (Addendum)
[facility] Counselor/Therapist Progress Note  Patient ID: Tracey Norris, MRN: [id number],    Date: 05/16/2022  Time Spent: 50 minutes   Treatment Type: Individual Therapy  Reported Symptoms: anxiety, some depression  Mental Status Exam:  Appearance:   Casual     Behavior:  Appropriate, Sharing, and Motivated  Motor:  Normal  Speech/Language:   Clear and Coherent  Affect:  Depressed and anxious  Mood:  anxious and depressed  Thought process:  goal directed  Thought content:    Some obsessive thoughts  Sensory/Perceptual disturbances:    WNL  Orientation:  oriented to person, place, time/date, situation, day of week, month of year, year, and stated date of May 16, 2022  Attention:  Fair  Concentration:  Fair  Memory:  Some memory concerns and is in touch with her primary care Dr. Tomasa Blase in [facility] of knowledge:   Good and Fair  Insight:    Good and Fair  Judgment:   Good  Impulse Control:  Good   Risk Assessment: Danger to Self:  No Self-injurious Behavior: No Danger to Others: No Duty to Warn:no Physical Aggression / Violence:No  Access to Firearms a concern: No  Gang Involvement:No   Subjective: Patient in today and reports symptoms as being: "my anxiety has been higher than normal especially with some bowel issue that she is in in treatment for." Worrying often but shares that she is noticing a little bit of progress and trying to interrupt some of her tendency to automatically stress and be anxious at times around physical issues, when interacting with other people. Today reports frustration with "getting older, not having the stamina I used to have, worrying about myself and my husband." She reports some issues with family members who are frequently telling patient that she and husband should check on a retirement home and patient/husband have told family several times that they are not ready for such a move just yet. Easy to get stressed. Worked today  more with self-calming skills especially in worrying about family and other people, "I worry about everything and have most of my life" and struggling with trying to "make a change that I should have made much earlier in life".  Discussed this in more detail with patient including suggesting a couple of strategies to her that can help her confidence some with these issues.  Will follow-up again next session as we ran out of time today.  Interventions: Cognitive Behavioral Therapy and Ego-Supportive  Long-term goal: Reduce overall level, frequency, and intensity of the anxiety so that daily functioning is not impaired. Short-term goal: Increase understanding of beliefs and messages that produce the worry and anxiety. Strategies: Explore cognitive messages that mediate anxiety response and retrain in adaptive cognitions.  Diagnosis:   ICD-10-CM   1. Generalized anxiety disorder  F41.1      Plan: Patient today showing good participation in session today in talking further about her anxiety, worrying, and some family concerns. Bothered by some feedback from some of her family and was using some boundaries that can actually block patient from moving forward. Doesn't want to "block herself in" and wants to make better decisions for her and her husband's needs.  Worked more on how she can accomplish this with the help of a little more open mind which we discussed in more detail today.  Encouraged patient in her practice of more positive/self affirming behaviors as noted in session including: Feel encouraged about some of the progress  she is already making, look for more positives versus negatives each day, stay in the present and focus on what she can change or control, believe more in herself and her ability to make significant changes, practice intentional listening to others before responding or interrupting them, reduce her overthinking and over analyzing, keep her expectations realistic and be patient  with herself as she works on difficult changes that are needed in her life, and recognize the strength she shows when working with goal-directed behaviors to move in a direction that supports her improved emotional health and overall outlook.  Goal review and progress/challenges noted with patient.  Next appointment within 3 weeks.   Mathis Fare, LCSW

## 2022-05-16 NOTE — Addendum Note (Signed)
Addended byMathis Fare on: 05/16/2022 01:02 PM   Modules accepted: Level of Service

## 2022-06-02 DIAGNOSIS — R151 Fecal smearing: Secondary | ICD-10-CM | POA: Diagnosis not present

## 2022-06-02 DIAGNOSIS — K5901 Slow transit constipation: Secondary | ICD-10-CM | POA: Diagnosis not present

## 2022-06-02 DIAGNOSIS — K219 Gastro-esophageal reflux disease without esophagitis: Secondary | ICD-10-CM | POA: Diagnosis not present

## 2022-06-02 DIAGNOSIS — R110 Nausea: Secondary | ICD-10-CM | POA: Diagnosis not present

## 2022-06-08 ENCOUNTER — Ambulatory Visit (INDEPENDENT_AMBULATORY_CARE_PROVIDER_SITE_OTHER): Payer: Medicare Other | Admitting: Psychiatry

## 2022-06-08 DIAGNOSIS — F411 Generalized anxiety disorder: Secondary | ICD-10-CM

## 2022-06-08 NOTE — Progress Notes (Signed)
[facility] Counselor/Therapist Progress Note  Patient ID: Tracey Norris, MRN: [id number],    Date: 06/08/2022  Time Spent: 55 minutes   Treatment Type: Individual Therapy  Reported Symptoms: anxiety, physical issues (gastro) "being addressed"  Mental Status Exam:  Appearance:   Casual     Behavior:  Appropriate, Sharing, and Motivated  Motor:  Normal  Speech/Language:   Clear and Coherent  Affect:  anxious  Mood:  anxious  Thought process:  goal directed  Thought content:    Rumination and some obsessive thoughts  Sensory/Perceptual disturbances:    WNL  Orientation:  oriented to person, place, time/date, situation, day of week, month of year, year, and stated date of Jun 08, 2022  Attention:  Fair  Concentration:  Fair  Memory:  Some short term memory concerns  Fund of knowledge:   Good  Insight:    Good and Fair  Judgment:   Good  Impulse Control:  Good   Risk Assessment: Danger to Self:  No Self-injurious Behavior: No Danger to Others: No Duty to Warn:no Physical Aggression / Violence:No  Access to Firearms a concern: No  Gang Involvement:No   Subjective:   Patient in today reporting anxiety and "gastro" issues being addressed. Patient states she's not real confident in herself but does feel "I sometimes do feel better but then something pops up in my mind that worries me and I try to interrupt those thoughts and try to create "thoughts that are not so worrisome".  Does show more effort as described in session but changing her thinking is challenging. Family concerns with high school grandchild with whom patient is close, and is concerned for him. Worked with her on some anxiety/thought  strategies that can help her in task completion, "worry interruption", and sticking with topics in her conversations.  Emphasized her strengths with her versus what she perceives as her weaknesses.  Further reinforced some self-calming skills that we worked on in previous  session.  Does seem a little bit stronger and her understanding of some of her history of these behaviors and how they continue into the present, including the effect they have on her in the present and her outlook for the future.  Interventions: Cognitive Behavioral Therapy, Solution-Oriented/Positive Psychology, and Ego-Supportive  Long-term goal: Reduce overall level, frequency, and intensity of the anxiety so that daily functioning is not impaired. Short-term goal: Increase understanding of beliefs and messages that produce the worry and anxiety. Strategies: Explore cognitive messages that mediate anxiety response and retrain in adaptive cognitions.  Diagnosis:   ICD-10-CM   1. Generalized anxiety disorder  F41.1      Plan:  Patient actively participating in session today as she worked further on her anxiety, excessive worrying, and self calming skills.  Patient is making progress gradually, and needs to continue working with goal-directed behaviors to move in a forward direction. Encouraged patient in her practice of more positive/self affirming behaviors as noted in session including: Feel encouraged about some of the progress she is making, look for positives versus negatives each day, stay in the present focusing what she can change or control, believing herself more and her ability to make significant changes, practice intentional listening to others before responding or interrupting them, reduce her overthinking and over analyzing, keep her expectations realistic and be patient with herself as she works on difficult changes that are needed in her life, and realize the strengths she shows when working with goal-directed behaviors to move in a  direction that supports her improved emotional health and overall wellbeing.  Goal review and progress/challenges noted with patient.  Next appointment within 3 weeks.   Mathis Fare, LCSW

## 2022-06-15 DIAGNOSIS — D519 Vitamin B12 deficiency anemia, unspecified: Secondary | ICD-10-CM | POA: Diagnosis not present

## 2022-07-06 ENCOUNTER — Ambulatory Visit (INDEPENDENT_AMBULATORY_CARE_PROVIDER_SITE_OTHER): Payer: Medicare Other | Admitting: Psychiatry

## 2022-07-06 DIAGNOSIS — F411 Generalized anxiety disorder: Secondary | ICD-10-CM | POA: Diagnosis not present

## 2022-07-06 NOTE — Progress Notes (Signed)
[facility] Counselor/Therapist Progress Note  Patient ID: Tracey Norris, MRN: [id number],    Date: 07/06/2022  Time Spent: 50 minutes   Treatment Type: Individual Therapy  Reported Symptoms: anxiety, some depression  Mental Status Exam:  Appearance:   Casual and Neat     Behavior:  Appropriate, Sharing, and Motivated  Motor:  Normal  Speech/Language:   Clear and Coherent  Affect:  Anxious, some depression  Mood:  anxious and some depression  Thought process:  Some tangentiality  Thought content:    Some obsessive thinking  Sensory/Perceptual disturbances:    WNL  Orientation:  oriented to person, place, time/date, situation, day of week, month of year, year, and stated date of July 06, 2022  Attention:  Fair  Concentration:  Fair  Memory:  Some short term memory issues and Dr is aware  Fund of knowledge:   Good and Fair  Insight:    Good and Fair  Judgment:   Good  Impulse Control:  Good and Fair   Risk Assessment: Danger to Self:  No Self-injurious Behavior: No Danger to Others: No Duty to Warn:no Physical Aggression / Violence:No  Access to Firearms a concern: No  Gang Involvement:No   Subjective:  Patient in for session today and reporting anxiety, some depression, especially related to some of her ongoing gastro issues, although she feels that she may have started getting a little better.  Needed session today to share and process further some of her own health conditions and family situations that adds to her worry and anxiety.  Some family are living further away now which creates some sadness for patient.  Loss of some confidence in her own self as she notes that she does not deal very well with changes and also feels her memory is "a little bit worse" and adds that she is in contact with her doctor who handles her medications for that.  Hurt by the attitude of next-door neighbor which we processed in session today.  Knows and experiences trying to change  her attitude and thinking about certain things is very difficult for her, and some of the memory challenges she is experiencing adds to that difficulty.  Is showing good effort in session today and she was able to name several positives in her life mostly ones that revolve around family, children and grandchildren.  Remains concerned about the high school grandchild that she spoke about last time and states his situation is in a better currently.  Reviewed some self calming skills that have been helpful to patient in the past.  Has been able to work on letting go of at least 1 issue with a neighbor, not completely but at least partially letting go which for this patient is progress.  Interventions: Cognitive Behavioral Therapy and Ego-Supportive  Long-term goal: Reduce overall level, frequency, and intensity of the anxiety so that daily functioning is not impaired. Short-term goal: Increase understanding of beliefs and messages that produce the worry and anxiety. Strategies: Explore cognitive messages that mediate anxiety response and retrain in adaptive cognitions.  Diagnosis:   ICD-10-CM   1. Generalized anxiety disorder  F41.1      Plan:  Patient actively involved in session today as she focused more on her excessive worrying, anxiety, self calming skills, and self-esteem. She does report progress and needs to continue her work with goal-directed behaviors to move in a forward direction. Encouraged patient in her practice of more positive and self affirming behaviors as noted  in session including:Feeling encouraged about some progress she has made even though it is difficult at times, looking for more positives versus negatives daily, stay in the present focusing on what she can change or contro, believing in herself more and her ability to make significant changes, practice intentional listening to others before responding or interrupting them, reduce her overthinking and over-analyzing, keep her  expectations realistic and be patient with herself as she works with difficult changes that are needed in her life, and realize the strengths she shows when working with goal-directed behaviors to move in a direction that supports her improved emotional health and overall well-being. .  Self rating scales: 1-10 depression scale-5 1-10 anxiety scale-7/8 1-10 self-esteem scale-6 1-10 motivation scale-6 1-10 hopefulness scale-7/8  Goal review and progress/challenges noted with patient.  Next appointment within 2 to 3 weeks.   Mathis Fare, LCSW

## 2022-07-08 DIAGNOSIS — F419 Anxiety disorder, unspecified: Secondary | ICD-10-CM | POA: Diagnosis not present

## 2022-07-08 DIAGNOSIS — D510 Vitamin B12 deficiency anemia due to intrinsic factor deficiency: Secondary | ICD-10-CM | POA: Diagnosis not present

## 2022-07-08 DIAGNOSIS — C50912 Malignant neoplasm of unspecified site of left female breast: Secondary | ICD-10-CM | POA: Diagnosis not present

## 2022-07-08 DIAGNOSIS — E785 Hyperlipidemia, unspecified: Secondary | ICD-10-CM | POA: Diagnosis not present

## 2022-07-08 DIAGNOSIS — Z79899 Other long term (current) drug therapy: Secondary | ICD-10-CM | POA: Diagnosis not present

## 2022-07-08 DIAGNOSIS — E559 Vitamin D deficiency, unspecified: Secondary | ICD-10-CM | POA: Diagnosis not present

## 2022-07-08 DIAGNOSIS — I10 Essential (primary) hypertension: Secondary | ICD-10-CM | POA: Diagnosis not present

## 2022-07-13 DIAGNOSIS — D519 Vitamin B12 deficiency anemia, unspecified: Secondary | ICD-10-CM | POA: Diagnosis not present

## 2022-07-15 DIAGNOSIS — Z1231 Encounter for screening mammogram for malignant neoplasm of breast: Secondary | ICD-10-CM | POA: Diagnosis not present

## 2022-07-25 ENCOUNTER — Ambulatory Visit (INDEPENDENT_AMBULATORY_CARE_PROVIDER_SITE_OTHER): Payer: Medicare Other | Admitting: Psychiatry

## 2022-07-25 DIAGNOSIS — F411 Generalized anxiety disorder: Secondary | ICD-10-CM | POA: Diagnosis not present

## 2022-07-25 NOTE — Progress Notes (Signed)
[facility] Counselor/Therapist Progress Note  Patient ID: Tracey Norris, MRN: [id number],    Date: 07/25/2022  Time Spent: 50 minutes   Treatment Type: Individual Therapy  Reported Symptoms: anxiety, anger (at times "I just have had enough")  Mental Status Exam:  Appearance:   Casual     Behavior:  Appropriate, Sharing, and wants to be more motivated or have better follow through  Motor:  Normal  Speech/Language:   Clear and Coherent  Affect:  Anxious, some anger  Mood:  anxious and some anger  Thought process:  goal directed  Thought content:    Obsessiveness "at times"  Sensory/Perceptual disturbances:    WNL  Orientation:  oriented to person, place, time/date, situation, day of week, month of year, year, and stated date of July 25, 2022  Attention:  Fair  Concentration:  Fair and Poor  Memory:  Short term memory issues and states Dr is aware  Fund of knowledge:   Good  Insight:    Fair  Judgment:   Good  Impulse Control:  Good   Risk Assessment: Danger to Self:  No Self-injurious Behavior: No Danger to Others: No Duty to Warn:no Physical Aggression / Violence:No  Access to Firearms a concern: No  Gang Involvement:No   Subjective:   Patient in session today reporting anxiety, anger, some hurt, and frustration increased even more due to recent bad experience at a medical office. Talking faster and angry as she shared what all had happened and how it left her feeling. Feels it temporarily made her anxiety "even worse". Still struggles with anxiety when things are out of her control, health issues, new situations, stressed with her gastro issues. Anxiety increases "when stressed". Some loss of confidence.Struggling more recently in adapting to changes and this plays out often in her personal and family life.  Worked on this especially some in areas of where things are changing and she is wanting to become more confident in managing changes more effectively.  Looked  at some healthier ways of trying to manage her anxiety, believe more in herself and her ability to make changes in the way she manages things in her personal life.  Self calming skills discussed more today especially since this has been a significant issue for her since her last appointment here due to some unexpected situations that have occurred.  (Not all details included in this note due to patient privacy needs.)  Interventions: Cognitive Behavioral Therapy and Ego-Supportive  Long-term goal: Reduce overall level, frequency, and intensity of the anxiety so that daily functioning is not impaired. Short-term goal: Increase understanding of beliefs and messages that produce the worry and anxiety. Strategies: Explore cognitive messages that mediate anxiety response and retrain in adaptive cognitions  Diagnosis:   ICD-10-CM   1. Generalized anxiety disorder  F41.1      Plan: Patient in for session today showing good participation as she worked further on her anxiety, self calming skills, excessive worrying, anger, and her.  She is making some progress and needs to continue working with goal-directed behaviors in order to move in a forward direction.Encouraged patient in her practice of more positive and self affirming behaviors as noted in session including: Feeling encouraged about some of the progress she has made even though it is difficult at times, looking for more positives versus negatives daily, stay in the present focusing on what she can change or control, believe more in herself and her ability to make significant changes, practice intentional  listening to others before responding or interrupting them, reduce her overthinking and over analyzing, keep her expectations realistic and be patient with herself as she works with difficult changes that are needed in her life, realize the strength that she shows when working with goal-directed behaviors to move in a direction that supports her  improved emotional health and outlook.  Self rating scales: (July 25, 2022) 1-10 depression scale-5 1-10 anxiety scale-8 1-10 self-esteem scale-6 1-10 motivation scale-6 1-10 hopefulness scale-7/8  Goal review and progress/challenges noted with patient.  Next appointment within 3 weeks.   Mathis Fare, LCSW

## 2022-08-17 DIAGNOSIS — D519 Vitamin B12 deficiency anemia, unspecified: Secondary | ICD-10-CM | POA: Diagnosis not present

## 2022-08-22 ENCOUNTER — Ambulatory Visit (INDEPENDENT_AMBULATORY_CARE_PROVIDER_SITE_OTHER): Payer: Medicare Other | Admitting: Psychiatry

## 2022-08-22 DIAGNOSIS — F411 Generalized anxiety disorder: Secondary | ICD-10-CM

## 2022-08-22 NOTE — Progress Notes (Signed)
[facility] Counselor/Therapist Progress Note  Patient ID: Raven Groebner, MRN: [id number],    Date: 08/22/2022  Time Spent: 55 minutes   Treatment Type: Individual Therapy  Reported Symptoms: anxiety, reporting some recent "forgetting" and anxious about it  Mental Status Exam:  Appearance:   Casual     Behavior:  Appropriate, Sharing, and Motivated  Motor:  Normal  Speech/Language:   Clear and Coherent  Affect:  anxious  Mood:  anxious  Thought process:  goal directed  Thought content:    Rumination  Sensory/Perceptual disturbances:    WNL  Orientation:  Not oriented to date, month, year "I rely on my iPad and phone"  Attention:  Good  Concentration:  Fair  Memory:  Some memory issues "short term"  Fund of knowledge:   Good and Fair  Insight:    Good  Judgment:   Good  Impulse Control:  Good   Risk Assessment: Danger to Self:  No Self-injurious Behavior: No Danger to Others: No Duty to Warn:no Physical Aggression / Violence:No  Access to Firearms a concern: No  Gang Involvement:No   Subjective:  Patient in today reporting anxiety especially after recent beach trip with family when patient's husband reported unusual behaviors in not recognizing other family members, some confusion that lasted several days about the location of their room location where they were staying. Symptoms not noticed before and patient's husband very concerned, some tears welling up in eyes, and wondering if she needs to see a neurologist or some testing.  Discusses this at more length, including how the confusion mentioned earlier, has created more uncertainty and worries. Has not happened upon her getting back home and symptoms other than anxiety not present today. Spoke with husband also and he is planning to check in with patient's medical doctor to inquire if patient might need to see a neurologist, which was supported in our session today.  (Not all details included in this note due to  patient privacy needs).  Interventions: Cognitive Behavioral Therapy and Ego-Supportive  Long-term goal: Reduce overall level, frequency, and intensity of the anxiety so that daily functioning is not impaired. Short-term goal: Increase understanding of beliefs and messages that produce the worry and anxiety. Strategies: Explore cognitive messages that mediate anxiety response and retrain in adaptive cognitions  Diagnosis:   ICD-10-CM   1. Generalized anxiety disorder  F41.1      Plan: Patient in for session today and husband had brought her in due to some unexpected concerns that he wanted to discuss, as noted above.  Was concerned about some behaviors patient exhibited during their vacation time away with family where she was not able to recognize people for periods of time.  Discussed this in more detail and encouraged him to follow-up with her primary care physician who may want to refer her to a neurologist, based on details shared by patient and husband.  Patient has had some progress made on her goals and needs to continue working with goal-directed behaviors in terms of her treatment here at our office.  Encouraged patient in her practice of more positive and self affirming behaviors as noted in session including: Looking for more positives versus negatives daily, staying in the present focusing on what she can change or control rather than jumping ahead and worrying about the future, believe more in her ability to make significant changes, practice intentional listening to others before responding or interrupting them, reduce overthinking and over analyzing, and recognize the strengths  she shows when working with goal-directed behaviors to move in the direction that supports her improved emotional health and overall outlook.  Goal review and progress/challenges noted with patient.  Next appointment within 3 weeks.   Mathis Fare, LCSW

## 2022-08-24 DIAGNOSIS — F419 Anxiety disorder, unspecified: Secondary | ICD-10-CM | POA: Diagnosis not present

## 2022-08-24 DIAGNOSIS — R413 Other amnesia: Secondary | ICD-10-CM | POA: Diagnosis not present

## 2022-08-24 DIAGNOSIS — F4489 Other dissociative and conversion disorders: Secondary | ICD-10-CM | POA: Diagnosis not present

## 2022-08-30 DIAGNOSIS — F4489 Other dissociative and conversion disorders: Secondary | ICD-10-CM | POA: Diagnosis not present

## 2022-08-30 DIAGNOSIS — G319 Degenerative disease of nervous system, unspecified: Secondary | ICD-10-CM | POA: Diagnosis not present

## 2022-08-30 DIAGNOSIS — I639 Cerebral infarction, unspecified: Secondary | ICD-10-CM | POA: Diagnosis not present

## 2022-08-30 DIAGNOSIS — R410 Disorientation, unspecified: Secondary | ICD-10-CM | POA: Diagnosis not present

## 2022-09-14 ENCOUNTER — Ambulatory Visit (INDEPENDENT_AMBULATORY_CARE_PROVIDER_SITE_OTHER): Payer: Medicare Other | Admitting: Psychiatry

## 2022-09-14 DIAGNOSIS — F411 Generalized anxiety disorder: Secondary | ICD-10-CM | POA: Diagnosis not present

## 2022-09-14 NOTE — Progress Notes (Signed)
[facility] Counselor/Therapist Progress Note  Patient ID: Tracey Norris, MRN: [id number],    Date: 09/14/2022  Time Spent: 48 minutes   Treatment Type: Individual Therapy  Reported Symptoms: anxiety, "and anxious about forgetting but that has improved some"  Mental Status Exam:  Appearance:   Casual and Neat     Behavior:  Appropriate, Sharing, and Motivated  Motor:  Normal  Speech/Language:   Clear and Coherent  Affect:  anxious  Mood:  anxious  Thought process:  goal directed  Thought content:    Rumination and some repeating  Sensory/Perceptual disturbances:    WNL  Orientation:  oriented to person, place, time/date, situation, day of week, month of year, year, and stated date of Sept. 4, 2024  Attention:  Fair  Concentration:  Fair  Memory:  Some short term memory issues  Fund of knowledge:   Fair  Insight:    Good and Fair  Judgment:   Good and Fair  Impulse Control:  Good and Fair   Risk Assessment: Danger to Self:  No Self-injurious Behavior: No Danger to Others: No Duty to Warn:no Physical Aggression / Violence:No  Access to Firearms a concern: No  Gang Involvement:No   Subjective:  Patient today reports anxiety, some decrease in her "scatteredness" and reports no further incidents of not recognizing extended family members. Still has significant anxiety and some memory concerns that she and husband are addressing with her PCP in [place], who is reportedly referring patient to a neurologist. Needed session today to share her concerns with physical and memory issues, was able to openly share and feel supported in her concerns and fears.   Interventions: Cognitive Behavioral Therapy and Ego-Supportive  Long-term goal: Reduce overall level, frequency, and intensity of the anxiety so that daily functioning is not impaired. Short-term goal: Increase understanding of beliefs and messages that produce the worry and anxiety. Strategies: Explore cognitive  messages that mediate anxiety response and retrain in adaptive cognitions  Diagnosis:   ICD-10-CM   1. Generalized anxiety disorder  F41.1      Plan:   Patient in session today reporting anxiety, some memory concerns, and worries about having memory issues. Acknowledges that it's hard for her to remain in the present without jumping ahead into the future, and did work on this in session today. Encouraged her to practice this more between sessions and she did notice a bit of difference when she worked in session on not jumping ahead. Smiling more by end of session. To continue working on her goals and trying to believe more in some of the changes on which she can work. Patient has made some progress and working on her goals and needs to continue working with goal-directed behaviors to build on some initial progress and move in a forward direction. Reminded and encouraged patient in her practice of more positive and self affirming behaviors as noted and discussed in session including: Looking for more positives versus negatives daily, remain in the present focusing on what she can change, refrain from jumping ahead and worrying about the future, believe more in her ability to make significant changes, practice intentional listening to others before responding or interrupting them, reduce overthinking and over analyzing, and recognize the strength she shows when working with goal-directed behaviors to move in a direction that supports her improved emotional health and wellbeing.  Goal review and progress/challenges noted with patient.  Next appointment within 3 weeks.   Mathis Fare, LCSW

## 2022-09-21 DIAGNOSIS — D519 Vitamin B12 deficiency anemia, unspecified: Secondary | ICD-10-CM | POA: Diagnosis not present

## 2022-09-28 DIAGNOSIS — F419 Anxiety disorder, unspecified: Secondary | ICD-10-CM | POA: Diagnosis not present

## 2022-09-28 DIAGNOSIS — R413 Other amnesia: Secondary | ICD-10-CM | POA: Diagnosis not present

## 2022-09-28 DIAGNOSIS — F4489 Other dissociative and conversion disorders: Secondary | ICD-10-CM | POA: Diagnosis not present

## 2022-10-11 DIAGNOSIS — C50912 Malignant neoplasm of unspecified site of left female breast: Secondary | ICD-10-CM | POA: Diagnosis not present

## 2022-10-11 DIAGNOSIS — E559 Vitamin D deficiency, unspecified: Secondary | ICD-10-CM | POA: Diagnosis not present

## 2022-10-11 DIAGNOSIS — Z23 Encounter for immunization: Secondary | ICD-10-CM | POA: Diagnosis not present

## 2022-10-11 DIAGNOSIS — E785 Hyperlipidemia, unspecified: Secondary | ICD-10-CM | POA: Diagnosis not present

## 2022-10-11 DIAGNOSIS — D510 Vitamin B12 deficiency anemia due to intrinsic factor deficiency: Secondary | ICD-10-CM | POA: Diagnosis not present

## 2022-10-11 DIAGNOSIS — F419 Anxiety disorder, unspecified: Secondary | ICD-10-CM | POA: Diagnosis not present

## 2022-10-11 DIAGNOSIS — R413 Other amnesia: Secondary | ICD-10-CM | POA: Diagnosis not present

## 2022-10-13 DIAGNOSIS — H5203 Hypermetropia, bilateral: Secondary | ICD-10-CM | POA: Diagnosis not present

## 2022-10-13 DIAGNOSIS — H04123 Dry eye syndrome of bilateral lacrimal glands: Secondary | ICD-10-CM | POA: Diagnosis not present

## 2022-10-13 DIAGNOSIS — H26492 Other secondary cataract, left eye: Secondary | ICD-10-CM | POA: Diagnosis not present

## 2022-10-13 DIAGNOSIS — Z961 Presence of intraocular lens: Secondary | ICD-10-CM | POA: Diagnosis not present

## 2022-10-13 DIAGNOSIS — H52223 Regular astigmatism, bilateral: Secondary | ICD-10-CM | POA: Diagnosis not present

## 2022-10-19 ENCOUNTER — Ambulatory Visit: Payer: Medicare Other | Admitting: Psychiatry

## 2022-10-19 DIAGNOSIS — F411 Generalized anxiety disorder: Secondary | ICD-10-CM

## 2022-10-19 NOTE — Progress Notes (Signed)
[facility] Counselor/Therapist Progress Note  Patient ID: Tracey Norris, MRN: [id number],    Date: 10/19/2022  Time Spent: 53 minutes   Treatment Type: Individual Therapy  Reported Symptoms: anxiety, forgetful and gets irritated that she forgets, "my trigger goes off too much"; however anxiety is not as strong as it has been in more recent sessions  Mental Status Exam:  Appearance:   Casual     Behavior:  Appropriate, Sharing, and Motivated  Motor:  Normal  Speech/Language:   Clear and Coherent  Affect:  anxious  Mood:  anxious  Thought process:  goal directed  Thought content:    Rumination  Sensory/Perceptual disturbances:    WNL  Orientation:  oriented to person, place, time/date, situation, day of week, month of year, year, and stated date of Oct. 9, 2024  Attention:  Fair  Concentration:  Fair and Poor  Memory:  Having memory issues and is to see neurologist Oct. 14, 2024  Fund of knowledge:   Fair  Insight:    Good and Fair  Judgment:   Good and Fair  Impulse Control:  Good and Fair   Risk Assessment: Danger to Self:  No Self-injurious Behavior: No Danger to Others: No Duty to Warn:no Physical Aggression / Violence:No  Access to Firearms a concern: No  Gang Involvement:No   Subjective:   Patient today reporting anxiety, some "worries about possibly having memory concerns", feelings of scatteredness at times, still some short term memory issues at times. Has appt next week with Neurologist. Anxious and some worrying today about "my anxiety, some confusion at times, and sometimes quick to snap when stressed." Reports some decrease in scatteredness. Tends to interact mostly with her children and grandchildren, a neighbor who has had some health issues. Discussed her anxiety and worrying and patient feels in some ways she is a little better but hard to let go of her fears. Reports her "regular medical doctor gave her a medication "a memory medicine" and feels it  has helped. Dr is in a different health network and patient is to bring in copy of her paperwork from having seen her PCP that has provided some eval and medication for patient and her memory .  In talking with and observing patient today she does seem a little less anxious and noted some decrease in her scattered feelings.  States that she has not had any further occasions of not recognizing extended family members.  Presents with anxiety today however the anxiety is less than at her last appointment.  Will return in approximately 3 weeks.  Interventions: Cognitive Behavioral Therapy and Ego-Supportive  Long-term goal: Reduce overall level, frequency, and intensity of the anxiety so that daily functioning is not impaired. Short-term goal: Increase understanding of beliefs and messages that produce the worry and anxiety. Strategies: Explore cognitive messages that mediate anxiety response and retrain in adaptive cognitions  Diagnosis:   ICD-10-CM   1. Generalized anxiety disorder  F41.1      Plan: Patient today in session and reports anxiety and some worries however not as strong as they have been in more recent appointments.  Noticeably different that she could sit still longer and she spoke in ways that were less anxious.  Not that the anxiety was gone but noticeably a little less than usual and husband has seen this also.  As noted above her PCP had given her some medication to try that might help her memory.  She was not sure of the  name of it but referred to it as "Mem".  Patient awaiting her appointment next week with a neurologist for testing.  Does still have some problems remaining in the present and not jumping ahead into the future, although not quite as often.  She is showing progress and needs to continue working with goal-directed behaviors to build on this progress and move in a more hopeful direction. Reminded and encouraged patient in practicing more positive and self affirming  behaviors as noted and discussed in sessions including: Looking for more positives versus negatives daily, remain in the present focusing on what she can change, refrain from jumping ahead and worrying about the future, believe more in her ability to make significant changes, practice intentional listening to others before responding or interrupting them, reduce overthinking and over analyzing, and realize the strength she shows when working with goal-directed behaviors to move in a direction that supports her improved emotional health and her outlook into the future.  Goal review and progress/challenges noted with patient.  Next appointment within 3 weeks.   Mathis Fare, LCSW

## 2022-10-24 DIAGNOSIS — K5909 Other constipation: Secondary | ICD-10-CM | POA: Diagnosis not present

## 2022-10-26 DIAGNOSIS — D519 Vitamin B12 deficiency anemia, unspecified: Secondary | ICD-10-CM | POA: Diagnosis not present

## 2022-10-27 DIAGNOSIS — H26492 Other secondary cataract, left eye: Secondary | ICD-10-CM | POA: Diagnosis not present

## 2022-10-27 DIAGNOSIS — Z961 Presence of intraocular lens: Secondary | ICD-10-CM | POA: Diagnosis not present

## 2022-10-27 DIAGNOSIS — H26493 Other secondary cataract, bilateral: Secondary | ICD-10-CM | POA: Diagnosis not present

## 2022-10-27 DIAGNOSIS — H5203 Hypermetropia, bilateral: Secondary | ICD-10-CM | POA: Diagnosis not present

## 2022-10-27 DIAGNOSIS — H52223 Regular astigmatism, bilateral: Secondary | ICD-10-CM | POA: Diagnosis not present

## 2022-10-27 DIAGNOSIS — H04123 Dry eye syndrome of bilateral lacrimal glands: Secondary | ICD-10-CM | POA: Diagnosis not present

## 2022-10-31 DIAGNOSIS — G301 Alzheimer's disease with late onset: Secondary | ICD-10-CM | POA: Diagnosis not present

## 2022-10-31 DIAGNOSIS — F02B2 Dementia in other diseases classified elsewhere, moderate, with psychotic disturbance: Secondary | ICD-10-CM | POA: Diagnosis not present

## 2022-11-02 DIAGNOSIS — F419 Anxiety disorder, unspecified: Secondary | ICD-10-CM | POA: Diagnosis not present

## 2022-11-02 DIAGNOSIS — R413 Other amnesia: Secondary | ICD-10-CM | POA: Diagnosis not present

## 2022-11-02 DIAGNOSIS — F4489 Other dissociative and conversion disorders: Secondary | ICD-10-CM | POA: Diagnosis not present

## 2022-11-09 ENCOUNTER — Ambulatory Visit (INDEPENDENT_AMBULATORY_CARE_PROVIDER_SITE_OTHER): Payer: Medicare Other | Admitting: Psychiatry

## 2022-11-09 DIAGNOSIS — F411 Generalized anxiety disorder: Secondary | ICD-10-CM | POA: Diagnosis not present

## 2022-11-09 NOTE — Progress Notes (Signed)
[facility] Counselor/Therapist Progress Note  Patient ID: Tracey Norris, MRN: [id number],    Date: 11/09/2022  Time Spent: 50 minutes  Treatment Type: Individual Therapy  Reported Symptoms: anxious, forgetful, irritable "at times", frustrated at her not being able to get her glasses updated til December but is on cancellation list   Mental Status Exam:  Appearance:   Casual and Neat     Behavior:  Appropriate, Sharing, and Motivated  Motor:  Normal  Speech/Language:   Clear and Coherent  Affect:  "Sometimes irritable"   Mood:  anxious and irritable  Thought process:  "Seem to be having some memory issues and has had recent testing"  Thought content:    Rumination  Sensory/Perceptual disturbances:    WNL  Orientation:  oriented to person, place, time/date, situation, day of week, month of year, year, and stated date of Oct. 30, 2024  Attention:  Fair  Concentration:  Fair  Memory:  Currently being tested; results not received yet  Fund of knowledge:   Fair  Insight:    Fair  Judgment:   Good and Fair  Impulse Control:  Fair   Risk Assessment: Danger to Self:  No Self-injurious Behavior: No Danger to Others: No Duty to Warn:no Physical Aggression / Violence:No  Access to Firearms a concern: No  Gang Involvement:No   Subjective:   Patient in session today reporting anxiety, forgetfulness, worrying, forgetfulness and being irritated because of this. Her neurologist is working with her PCP on helping her. Recently they've been working on reducing her Xanax and adding Buspar, eventually hoping to get her off the Xanax completely as it can reportedly contribute patient's memory loss. Patient anxious about "my future and worrying about it." Husband very supportive. Adult kids wanting them to consider a retirement community but patient and husband are "not wanting that right now." They enjoy their yard and neighborhood. Patient today on-edge some especially when talking  about the future, but even then seemed a little calmer than in the past.  Should be getting paperwork soon from neurologist appointment and will share it with this therapist at that time.  Encouraged patient and working on her management and anxiety, decreasing her worrying behaviors, and not jumping to conclusions about "what my memory issues might be and how bad they might get".  Did seem to actually be doing better today and not assuming the worst.  Is fortunate that her husband is a very supportive gentleman and is involved at her appointments and hears feedback from her doctors.  Interventions: Cognitive Behavioral Therapy and Ego-Supportive  Long-term goal: Reduce overall level, frequency, and intensity of the anxiety so that daily functioning is not impaired. Short-term goal: Increase understanding of beliefs and messages that produce the worry and anxiety. Strategies: Explore cognitive messages that mediate anxiety response and retrain in adaptive cognitions    Diagnosis:   ICD-10-CM   1. Generalized anxiety disorder  F41.1      Plan:  Patient reports worrying although not quite as much as she has in the past.  Sometimes her degree of worrying seems to be affected whether she is alone or her husband is with her.  Did seem a little calmer at times in session today and able to remain still at times which is different for her.  She will be following up again with her medical doctor and neurologist. Patient showing progress and needs to continue her work with goal-directed behaviors as she works to continue this progress to  move in a more healthy and hopeful direction. Patient has made progress and needs to continue working with goal-directed behaviors to keep moving and a direction of experiencing as much progress of his possible and being able to cope with challenges. Reminded and encouraged patient in her practice of positive and self-affirming behaviors as noted and discussed and sessions  including: Focus more on the positives versus negatives daily, stay in the present focusing on what she can change, letting others help her as needed, refrain from jumping ahead and worrying about the future, practice intentional listening to others before responding, reduce overthinking and over analyzing as she is able, focus on her strengths and trust the help of others, and recognize the strengths she shows when working with goal-directed behaviors to move in a direction that supports her improved emotional health and her wellbeing into the future.  Goal review and progress/challenges noted with patient.  Next appt within 3 to 4 weeks.   Mathis Fare, LCSW

## 2022-11-30 DIAGNOSIS — D519 Vitamin B12 deficiency anemia, unspecified: Secondary | ICD-10-CM | POA: Diagnosis not present

## 2022-12-05 ENCOUNTER — Ambulatory Visit: Payer: Medicare Other | Admitting: Psychiatry

## 2022-12-05 DIAGNOSIS — F411 Generalized anxiety disorder: Secondary | ICD-10-CM

## 2022-12-05 NOTE — Progress Notes (Signed)
[facility] Counselor/Therapist Progress Note  Patient ID: Tracey Norris, MRN: [id number],    Date: 12/05/2022  Time Spent: 53 minutes   Treatment Type: Individual Therapy  Reported Symptoms: anxious but seems less anxious than previous appointment, forgetful, irritable "sometimes", frustrated with "what's happening with me and my memory" although her memory did seem some better today, worrying   Mental Status Exam:  Appearance:   Casual     Behavior:  Appropriate, Sharing, and Assertive; some agitation at times  Motor:  Normal  Speech/Language:   Clear and Coherent  Affect:  anxious  Mood:  anxious  Thought process:  goal directed  Thought content:    Rumination  Sensory/Perceptual disturbances:    WNL  Orientation:  oriented to person, place, time/date, situation, day of week, month of year, year, and stated date of Nov. 25, 2024  Attention:  Good/Fair   Concentration:  Good and Fair  Memory:  Challenged and "still have some memory issue"  Fund of knowledge:   Fair  Insight:    Fair  Judgment:   Good and Fair  Impulse Control:  Good   Risk Assessment: Danger to Self:  No Self-injurious Behavior: No Danger to Others: No Duty to Warn:no Physical Aggression / Violence:No  Access to Firearms a concern: No  Gang Involvement:No   Subjective:   Patient today in session and reporting anxiety, forgetfulness, worrying (but not as much as in the past), and feeling "irritated because of this."  Involved with this neurologist and her PCP.  Processed her anxiety and worrying primarily today in session as well as some fears about the future.  This did seem helpful to patient.  Did notice that overall she seems to be a little more calm today, more calm to the point that it was very noticeable and she seemed at a more relaxed pace and talking during session, asking questions, and responding to questions.  Still some anxiety present but not as strong as previously.  She is  remaining on medication as prescribed and following through on all of her doctor recommendations.  Her husband remains very dedicated and helpful for her, taking her to all of her appointments and hearing feedback from her care providers.  Interventions: Cognitive Behavioral Therapy, Solution-Oriented/Positive Psychology, and Ego-Supportive  Long-term goal: Reduce overall level, frequency, and intensity of the anxiety so that daily functioning is not impaired. Short-term goal: Increase understanding of beliefs and messages that produce the worry and anxiety. Strategies: Explore cognitive messages that mediate anxiety response and retrain in adaptive cognitions  Diagnosis:   ICD-10-CM   1. Generalized anxiety disorder  F41.1      Plan:  Patient today reporting anxiety, irritability, worrying, some forgetfulness and at times in session today appeared more calm. Saw  She has made some progress and needs to continue working with goal-directed behaviors to keep moving in a direction of progress and being able to cope with changes. Recent appt with Dr. Hall Busing, a neurologist due to memory/neurological concerns. Patient concerned about some memory issues. Not driving anymore and reports she is typically not alone. Fears of the future especially about her health, memory and needed time in session today to share and process her thoughts about her memory issues. Reports trying to cope better with her memory issues including some strategies today that have helped her.  Encouraged patient in her practice of positive and self affirming behaviors as noted and discussed in sessions including: Focusing more on the  positives versus negatives each today, stay in the present focusing on what she can change, letting others help her as needed, refrain from jumping ahead and worrying about the future, practice intentional listening to others before responding, reduce overthinking and over analyzing, focus on  her strengths and trust the help of others, and realize the strength she shows when working on goal-directed behaviors to move in a direction that supports her improved emotional health and her overall wellbeing.  Goal review and progress/challenges noted with patient.  Next appointment within 3 to 4 weeks.   Mathis Fare, LCSW

## 2022-12-05 NOTE — Progress Notes (Deleted)
      [facility] Counselor/Therapist Progress Note  Patient ID: Tracey Norris, MRN: [id number],    Date: 12/05/2022  Time Spent: ***   Treatment Type: {CH[facility] AMB THERAPY TYPES:[id number]}  Reported Symptoms: ***  Mental Status Exam:  Appearance:   {PSY:22683}     Behavior:  {PSY:[id number]}  Motor:  {PSY:22302}  Speech/Language:   {PSY:22685}  Affect:  {PSY:22687}  Mood:  {PSY:31886}  Thought process:  {PSY:31888}  Thought content:    {PSY:[id number]}  Sensory/Perceptual disturbances:    {[phone]}  Orientation:  {PSY:30297}  Attention:  {PSY:22877}  Concentration:  {PSY:815-305-5923}  Memory:  {PSY:701-533-1227}  Fund of knowledge:   {PSY:815-305-5923}  Insight:    {PSY:815-305-5923}  Judgment:   {PSY:[id number]}  Impulse Control:  {PSY:[id number]}   Risk Assessment: Danger to Self:  {PSY:22692} Self-injurious Behavior: {PSY:22692} Danger to Others: {PSY:22692} Duty to Warn:{PSY:311194} Physical Aggression / Violence:{PSY:21197} Access to Firearms a concern: {PSY:21197} Gang Involvement:{PSY:21197}  Subjective: ***   Interventions: {PSY:[id number]}  Diagnosis:No diagnosis found.  Plan: ***  Mathis Fare, LCSW

## 2022-12-24 DIAGNOSIS — K5909 Other constipation: Secondary | ICD-10-CM | POA: Diagnosis not present

## 2023-01-05 DIAGNOSIS — D510 Vitamin B12 deficiency anemia due to intrinsic factor deficiency: Secondary | ICD-10-CM | POA: Diagnosis not present

## 2023-01-09 DIAGNOSIS — H04123 Dry eye syndrome of bilateral lacrimal glands: Secondary | ICD-10-CM | POA: Diagnosis not present

## 2023-01-09 DIAGNOSIS — Z961 Presence of intraocular lens: Secondary | ICD-10-CM | POA: Diagnosis not present

## 2023-01-09 DIAGNOSIS — H5203 Hypermetropia, bilateral: Secondary | ICD-10-CM | POA: Diagnosis not present

## 2023-01-09 DIAGNOSIS — H26493 Other secondary cataract, bilateral: Secondary | ICD-10-CM | POA: Diagnosis not present

## 2023-01-09 DIAGNOSIS — H26492 Other secondary cataract, left eye: Secondary | ICD-10-CM | POA: Diagnosis not present

## 2023-01-09 DIAGNOSIS — H52223 Regular astigmatism, bilateral: Secondary | ICD-10-CM | POA: Diagnosis not present

## 2023-01-10 DIAGNOSIS — H26493 Other secondary cataract, bilateral: Secondary | ICD-10-CM | POA: Diagnosis not present

## 2023-01-10 DIAGNOSIS — H43811 Vitreous degeneration, right eye: Secondary | ICD-10-CM | POA: Diagnosis not present

## 2023-01-10 DIAGNOSIS — Z961 Presence of intraocular lens: Secondary | ICD-10-CM | POA: Diagnosis not present

## 2023-01-10 DIAGNOSIS — H18413 Arcus senilis, bilateral: Secondary | ICD-10-CM | POA: Diagnosis not present

## 2023-01-10 DIAGNOSIS — H26492 Other secondary cataract, left eye: Secondary | ICD-10-CM | POA: Diagnosis not present

## 2023-01-16 DIAGNOSIS — R413 Other amnesia: Secondary | ICD-10-CM | POA: Diagnosis not present

## 2023-01-16 DIAGNOSIS — I10 Essential (primary) hypertension: Secondary | ICD-10-CM | POA: Diagnosis not present

## 2023-01-16 DIAGNOSIS — Z9181 History of falling: Secondary | ICD-10-CM | POA: Diagnosis not present

## 2023-01-16 DIAGNOSIS — E559 Vitamin D deficiency, unspecified: Secondary | ICD-10-CM | POA: Diagnosis not present

## 2023-01-16 DIAGNOSIS — E785 Hyperlipidemia, unspecified: Secondary | ICD-10-CM | POA: Diagnosis not present

## 2023-01-16 DIAGNOSIS — D510 Vitamin B12 deficiency anemia due to intrinsic factor deficiency: Secondary | ICD-10-CM | POA: Diagnosis not present

## 2023-01-16 DIAGNOSIS — C50912 Malignant neoplasm of unspecified site of left female breast: Secondary | ICD-10-CM | POA: Diagnosis not present

## 2023-01-16 DIAGNOSIS — F419 Anxiety disorder, unspecified: Secondary | ICD-10-CM | POA: Diagnosis not present

## 2023-01-31 DIAGNOSIS — H26491 Other secondary cataract, right eye: Secondary | ICD-10-CM | POA: Diagnosis not present

## 2023-02-07 DIAGNOSIS — G301 Alzheimer's disease with late onset: Secondary | ICD-10-CM | POA: Diagnosis not present

## 2023-02-07 DIAGNOSIS — F02B4 Dementia in other diseases classified elsewhere, moderate, with anxiety: Secondary | ICD-10-CM | POA: Diagnosis not present

## 2023-02-08 ENCOUNTER — Ambulatory Visit (INDEPENDENT_AMBULATORY_CARE_PROVIDER_SITE_OTHER): Payer: Medicare Other | Admitting: Psychiatry

## 2023-02-08 DIAGNOSIS — F411 Generalized anxiety disorder: Secondary | ICD-10-CM

## 2023-02-08 DIAGNOSIS — D510 Vitamin B12 deficiency anemia due to intrinsic factor deficiency: Secondary | ICD-10-CM | POA: Diagnosis not present

## 2023-02-08 NOTE — Progress Notes (Signed)
[facility] Counselor/Therapist Progress Note  Patient ID: Tracey Norris, MRN: [id number],    Date: 02/08/2023  Time Spent: 53 minutes   Treatment Type: Individual Therapy  Reported Symptoms: anxious, some forgetfulness, irritability improved, frustrated with "some memory issues" but becoming more self-accepting as we work on this in sessions along with helping her decrease her worrying.  Mental Status Exam:  Appearance:   Casual     Behavior:  Appropriate, Sharing, and Motivated  Motor:  Normal  Speech/Language:   Clear and Coherent  Affect:  anxious  Mood:  anxious  Thought process:  goal directed  Thought content:    Rumination  Sensory/Perceptual disturbances:    WNL  Orientation:  oriented to person, place, time/date, situation, day of week, month of year, year, and stated date of Jan. 29, 2025  Attention:  Fair  Concentration:  Fair  Memory:  Some memory issues per Dr. [name] (neurologist) and is under his care currently  Fund of knowledge:   Fair  Insight:    Fair  Judgment:   Good and Fair  Impulse Control:  Good and Fair   Risk Assessment: Danger to Self:  No Self-injurious Behavior: No Danger to Others: No Duty to Warn:no Physical Aggression / Violence:No  Access to Firearms a concern: No  Gang Involvement:No   Subjective:  Patient in session today reporting current symptoms of forgetfulness, anxiety, worrying, and frustrated with some bowel issues. The worrying has decreased, and she is currently being followed by neurologist Dr. [name] and her PCP Dr Tracey Norris in [place], [place]. Repeating myself often, "but memory is not as bad this afternoon." "Am on some medication for memory and don't mind taking it."  "Dr has not given me firm diagnosis. Does state she notices some decrease in her anxiety and that feels good. Not as quick to feel "different from others" and does notice some less anxiety" which is encouraging for her. Not worry as much about the  past nor the future today. Seems more encouraged, pleasant, less forgetting overall. Did well in further processing some of her concerns about her memory and overall health. Dedicated husband very helpful and supportive to patient.   Interventions: Cognitive Behavioral Therapy and Ego-Supportive  Long-term goal: Reduce overall level, frequency, and intensity of the anxiety so that daily functioning is not impaired. Short-term goal: Increase understanding of beliefs and messages that produce the worry and anxiety. Strategies: Explore cognitive messages that mediate anxiety response and retrain in adaptive cognitions   Diagnosis:   ICD-10-CM   1. Generalized anxiety disorder  F41.1      Plan:    Patient continues working today on her symptoms of anxiety, excessive worrying, some irritability, and forgetfulness.  Looking more realistically at what she can control versus cannot control, and accentuating her need to follow-up on doctor recommendations to help with current symptoms especially related to memory and functioning.  Patient has made some progress in certain areas of her therapy and needs to continue working with goal-directed behaviors to move in a direction of as much progress as  possible and being able to cope with changes now and into the future.  Will continue under the care of Dr. [name], a neurologist, regarding her memory/neurological concerns.  Less fears of the future and feeling more grounded currently. Encouraged patient to be practicing more positive and self affirming behaviors as noted and discussed in sessions previously including: Stay in the present focusing on what she can  change or control, letting others help her as needed or wanted, focusing more on the positives versus negatives daily, refrain from jumping ahead and worrying about the future (as much as she can control), practice intentional listening to others before responding, reduce overthinking and over  analyzing, focus on her strengths and trust the help of others, and recognize the strength she shows when working on goal-directed behaviors to move in a direction that supports her improved emotional health and overall outlook into the future in which due to recent medical diagnoses there is some additional uncertainty regarding memory issues.  Goal review and progress/challenges noted with patient.  Next appointment within 3 to 4 weeks.   Mathis Fare, LCSW

## 2023-03-08 ENCOUNTER — Ambulatory Visit: Payer: Medicare Other | Admitting: Psychiatry

## 2023-03-09 DIAGNOSIS — K5909 Other constipation: Secondary | ICD-10-CM | POA: Diagnosis not present

## 2023-03-15 DIAGNOSIS — D510 Vitamin B12 deficiency anemia due to intrinsic factor deficiency: Secondary | ICD-10-CM | POA: Diagnosis not present

## 2023-03-16 ENCOUNTER — Ambulatory Visit: Payer: Medicare Other | Admitting: Psychiatry

## 2023-03-29 ENCOUNTER — Ambulatory Visit: Admitting: Psychiatry

## 2023-03-29 DIAGNOSIS — F411 Generalized anxiety disorder: Secondary | ICD-10-CM | POA: Diagnosis not present

## 2023-03-29 NOTE — Progress Notes (Signed)
 [facility] Counselor/Therapist Progress Note  Patient ID: Tracey Norris, MRN: [id number],    Date: 03/29/2023  Time Spent: 53 minutes   Treatment Type: Individual Therapy  Reported Symptoms:   anxious, forgetfulness and in midst of working with doctor re: memory issues, irritability increased, frustration high, tearfulness, self-judging, heightened emotionally   Mental Status Exam:  Appearance:   Casual and Neat     Behavior:  Appropriate, Sharing, Agitated, and Motivated  Motor:  Normal  Speech/Language:   Clear and Coherent  Affect:  Depressed and Tearful  Mood:  anxious, depressed, and irritable  Thought process:  goal directed  Thought content:    Rumination  Sensory/Perceptual disturbances:    WNL  Orientation:  oriented to person, place, time/date, situation, day of week, month of year, year, and stated date of March 29, 2023  Attention:  Fair  Concentration:  Good and Fair  Memory:  Some forgetting and is in midst of working with a Chiropractor of knowledge:   Good and Fair  Insight:    Good and Fair  Judgment:   Good and Fair  Impulse Control:  Good and Fair   Risk Assessment: Danger to Self:  No Self-injurious Behavior: No Danger to Others: No Duty to Warn:no Physical Aggression / Violence:No  Access to Firearms a concern: No  Gang Involvement:No   Subjective: Patient tearfully began session today and shared how stressed she has felt recently, mostly due to some ongoing physical issues, continued emotional issues, challenges in marital and family communication, and "down on herself", anxious, irritable, some memory issues and she is in midst of being evaluated more re: gastrointestinal physical problems. Easily stressed, easily hurt, easy to be self-negating. Struggles with her emotions including fear especially right now due to health issues. Needed and used most of session today to share and discussed lots of emotions, fear, and mixed feelings  about herself and others. Did seem to benefit patient and she shared more openly which helped her to eventually feel more grounded.  Reviewed with patient some of the main points from her talking today and also reviewed with her how well she approached things that have been very fearful for her regarding her health, her marriage and family.  Acknowledged that she felt more hope but also tired emotionally.  Is scheduling to return within the next 2 to 3 weeks based on some of her other health appointments and testing.  Encouraged her to use some of the tactics that we talked about today in session to better manage anxiety, processing it and not just trying to shove it away.  Husband remains supportive.  Interventions: Cognitive Behavioral Therapy and Ego-Supportive  Long-term goal: Reduce overall level, frequency, and intensity of the anxiety so that daily functioning is not impaired. Short-term goal: Increase understanding of beliefs and messages that produce the worry and anxiety. Strategies: Explore cognitive messages that mediate anxiety response and retrain in adaptive cognitions  Diagnosis:   ICD-10-CM   1. Generalized anxiety disorder  F41.1      Plan:  Patient today showing good motivation and participation as she worked further on her excessive worrying, anxiety, health concerns and fears, frustration, negating, and particular fears about memory issues.  She has a sister with severe memory issues who has had to go into an extended care facility and that has increased patient's fears but she did a good job and talking about that today in session.  She is to see her neurologist  again within approximately 8 weeks and I will be seeing her again before that time as noted above.  Reminded and encouraged patient in her practice of positive and self affirming behaviors as noted and discussed in sessions including: Remain in the present focusing on what she can change her control, letting others help  her as needed or wanted, focusing more on the positives versus negatives daily, refrain from jumping ahead and worrying about the future (as much as she can control), practice intentional listening to others before responding, reduce overthinking and over analyzing, focus on her strengths and trust the help of others, and realize the strength she shows when working on goal-directed behaviors to move in a direction that supports her improved emotional health and her overall wellbeing.  (Due to recent medical diagnoses there is some additional uncertainty regarding memory issues).  Goal review and progress/challenges noted with patient.  Patient appointment within 3 to 4 weeks.   Mathis Fare, LCSW

## 2023-03-30 DIAGNOSIS — K5909 Other constipation: Secondary | ICD-10-CM | POA: Diagnosis not present

## 2023-04-19 DIAGNOSIS — D510 Vitamin B12 deficiency anemia due to intrinsic factor deficiency: Secondary | ICD-10-CM | POA: Diagnosis not present

## 2023-04-24 DIAGNOSIS — D510 Vitamin B12 deficiency anemia due to intrinsic factor deficiency: Secondary | ICD-10-CM | POA: Diagnosis not present

## 2023-04-24 DIAGNOSIS — R413 Other amnesia: Secondary | ICD-10-CM | POA: Diagnosis not present

## 2023-04-24 DIAGNOSIS — C50912 Malignant neoplasm of unspecified site of left female breast: Secondary | ICD-10-CM | POA: Diagnosis not present

## 2023-04-24 DIAGNOSIS — F419 Anxiety disorder, unspecified: Secondary | ICD-10-CM | POA: Diagnosis not present

## 2023-04-24 DIAGNOSIS — E785 Hyperlipidemia, unspecified: Secondary | ICD-10-CM | POA: Diagnosis not present

## 2023-04-24 DIAGNOSIS — E559 Vitamin D deficiency, unspecified: Secondary | ICD-10-CM | POA: Diagnosis not present

## 2023-04-26 ENCOUNTER — Ambulatory Visit: Admitting: Psychiatry

## 2023-04-26 DIAGNOSIS — F411 Generalized anxiety disorder: Secondary | ICD-10-CM | POA: Diagnosis not present

## 2023-04-26 NOTE — Progress Notes (Signed)
 [facility] Counselor/Therapist Progress Note  Patient ID: Takiyah Bohnsack, MRN: [id number],    Date: 04/26/2023  Time Spent: 53 minutes   Treatment Type: Individual Therapy  Reported Symptoms: anxiety, some depression, tearfulness, forgetfulness, frustration, self-judging, very focused on her bowels "and this has been true a good part of my life" and is seeing a doctor re: bowel issues   Mental Status Exam:  Appearance:   Casual and Neat     Behavior:  Appropriate, Sharing, and Motivated  Motor:  Normal  Speech/Language:   Clear and Coherent  Affect:  Depressed and anxious  Mood:  anxious and depressed  Thought process:  goal directed  Thought content:    Rumination  Sensory/Perceptual disturbances:    WNL  Orientation:  oriented to person, place, time/date, situation, day of week, month of year, year, and had difficulty naming the date of today  Attention:  Fair  Concentration:  Good and Fair  Memory:  Reporting some "forgetting dates, names but I do better when not anxious"  Fund of knowledge:   Good and Fair  Insight:    Good and Fair  Judgment:   Good  Impulse Control:  Good and Fair   Risk Assessment: Danger to Self:  No Self-injurious Behavior: No Danger to Others: No Duty to Warn:no Physical Aggression / Violence:No  Access to Firearms a concern: No  Gang Involvement:No   Subjective:   Patient in session today motivated and focusing more on her symptoms of anxiety, irritability, "some memory issues" and is seeing her neurologist per her report, challenges within the family and marital communication, easy to be "down on myself", and frustrated with some of her gastrointestinal issues.  She continues to be easily hurt sometimes by things others say and sometimes her own self negating, and easily stressed. Today focusing further on her memory issues, anxiety, challenges within the family; And some physical health issues for which she reports she is receiving  help. Worked further today on her anxiety, following through on medical concerns, and better communication of her concerns and working through more of those concerns. Is to see Dr. Jamey Ripa again in Nov of this year re: her memory concerns. Encouraged to follow through on getting in touch with "female doctor" and have her concerns addressed (wants to to to office that her daughters goes to). Wants to visit more family and be more conscious in using her time well and possibly developing more friendships.  Interventions: Cognitive Behavioral Therapy and Ego-Supportive Long-term goal: Reduce overall level, frequency, and intensity of the anxiety so that daily functioning is not impaired. Short-term goal: Increase understanding of beliefs and messages that produce the worry and anxiety. Strategies: Explore cognitive messages that mediate anxiety response and retrain in adaptive cognitions   Diagnosis:   ICD-10-CM   1. Generalized anxiety disorder  F41.1       Plan:  Patient participating well in session today focusing more on her anxiety especially which is closely connected with her excessive worrying, health concerns, frustrations, some self negating, and fears especially in regards to memory issues.  Her fear is heightened due to having a sister with significant memory issues who is currently living in an extended care facility.  Patient is scheduled to see her neurologist again soon.  Also worked with patient some today on better communication especially her being able to listen and conversations with other people and not just talk or interrupt them and not really hear what they are saying.  She has described how that happens often within the family.  It does seem from talking with her here in the office that some of her anxiety and memory issues factor into her style of communication and difficulty really listening to what others say at times. Reminded and encouraged patient in her practice of  positive and self affirming behaviors as noted and discussed in sessions including: Remain in the present focusing on what she can change her control, letting others help her as needed or wanted, focusing more on the positives versus negatives daily, refrain from jumping ahead and worrying about the future (as much as she can control), practice intentional listening to others before responding, reduce overthinking and over analyzing, focus on her strengths and trust the help of others, and realize the strength she shows when working on goal-directed behaviors to move in a direction that supports her improved emotional health and her overall wellbeing. (Due to recent medical diagnoses there is some additional uncertainty regarding memory issues).   Goal review and progress/challenges noted with patient.  Next appointment within 3 to 4 weeks.   Kelleen Patee, LCSW

## 2023-05-17 DIAGNOSIS — D510 Vitamin B12 deficiency anemia due to intrinsic factor deficiency: Secondary | ICD-10-CM | POA: Diagnosis not present

## 2023-06-07 ENCOUNTER — Ambulatory Visit: Admitting: Psychiatry

## 2023-06-07 DIAGNOSIS — F411 Generalized anxiety disorder: Secondary | ICD-10-CM | POA: Diagnosis not present

## 2023-06-07 NOTE — Progress Notes (Signed)
 [facility] Counselor/Therapist Progress Note  Patient ID: Tracey Norris, MRN: [id number],    Date: 06/07/2023  Time Spent: 50 minutes   Treatment Type: Individual Therapy  Reported Symptoms: anxiety, some anger, sadness, forgetfulness, frustration, and continued frustrations "with my bowels"    Mental Status Exam:  Appearance:   Casual and Neat     Behavior:  Appropriate, Sharing, and Motivated  Motor:  Normal  Speech/Language:   Normal Rate  Affect:  Some sadness and anxiety  Mood:  angry, anxious, and frustrated  Thought process:  goal directed  Thought content:    Rumination  Sensory/Perceptual disturbances:    WNL  Orientation:  oriented to person, place, situation, day of week, month of year, year, and stated date of Jun 07, 2023  Attention:  Fair  Concentration:  Fair  Memory:  Having some memory concerns and is seeing a Chiropractor of knowledge:   Fair  Insight:    Fair  Judgment:   Good  Impulse Control:  Fair   Risk Assessment: Danger to Self:  No Self-injurious Behavior: No Danger to Others: No Duty to Warn:no Physical Aggression / Violence:No  Access to Firearms a concern: No  Gang Involvement:No   Subjective:   Patient today showing good motivation and reporting symptoms of anxiety, some memory issues, irritability challenges within her family and marital communication, "easy to be down on myself and upset with myself".  Reports some occasional gastrointestinal issues which can also be influenced by her anxiety. Sharing some "anxious thoughts" today re: her health, relationship with daughter involves stress and anxiety, her own health and possible alzheimers "eventually", "get mad at myself for not remembering things" but "I do try to pay better attention now to news and what's going on". Realizing more some memory issues and waiting to see neurologist, Dr. Alfornia Anis again in a couple months. Talking very freely today. Reports limited friendships  but also reports being close to multiple people within her immediate family and sees them as her friends. Describes some of her relationships within the family and how if feels supportive to her and how she does participate in conversations "but I don't participate as much in even family conversations as I'm more afraid I'll forget something". Continues to try and be involved with family and likes being amongst them but more guarded in conversation due to her memory issues.  Discussed with patient, what might help her not feel quite so guarded with family and might help her to feel more comfortable interacting a little more freely especially with them.  Encouraged to continue working on her anxiety, increased communication within family, and plan to stay in the present and not assuming "worst-case scenarios".  Does feel supported by family and is anxious to hear from Dr. Alfornia Anis further about her testing at her next appointment with him.  Interventions: Cognitive Behavioral Therapy, Solution-Oriented/Positive Psychology, and Ego-Supportive Long-term goal: Reduce overall level, frequency, and intensity of the anxiety so that daily functioning is not impaired. Short-term goal: Increase understanding of beliefs and messages that produce the worry and anxiety. Strategies: Explore cognitive messages that mediate anxiety response and retrain in adaptive cognitions   Diagnosis:   ICD-10-CM   1. Generalized anxiety disorder  F41.1      Plan:   Patient today in session showing good participation and working more on her anxiety particularly when it is closely connected with excessive worrying, frustrations, self negating, fears, and health concerns including memory issues.  She  reports that her fear is often heightened because of having a sister with significant memory issues who is now living in an extended care facility.  Patient admits she is working to try not to assume anything about her own health based  on anyone else's health but to wait and get a report from her neurologist based on his evaluation of her and her needs.  Did seem to help some and her being able to talk through her concerns at length in session today. Continued work with patient on ways to improve her communication especially being able to listen to the other person without interrupting and then respond once they have stopped talking.  Patient has shared previously that this type of thing happens often within the family and she recognizes it after the fact.  From observing her in sessions, this does not seem to be intentional on her part as far as interrupting others.  Some increased worry today and were able to talk through her worries which mainly revolve around her health and memory, as she does have other health issues in addition to her memory concerns.  (Not all details included in this note due to patient privacy needs). Reminded and encouraged patient in her practice of positive and self affirming behaviors as noted and discussed more in sessions including: Stay in the present focusing on what she can change or control, letting others help her as needed or wanted, focusing more on positives versus negatives or "what might happen", refrain from jumping ahead and worrying about the future (as much as she can control), practice intentional listening to others before responding, reduce overthinking and over analyzing, focus on her strengths and trust the help of others, and recognize the strength she shows when she works on goal-directed behaviors to move in a direction that supports her improved emotional health and overall wellbeing.  (Due to some recent medical diagnoses, there continues to be some additional uncertainty regarding the extent of memory issues).  Goal review and progress/challenges noted with patient.  Next appointment within 3 to 4 weeks.   Tracey Patee, LCSW

## 2023-06-14 DIAGNOSIS — K581 Irritable bowel syndrome with constipation: Secondary | ICD-10-CM | POA: Diagnosis not present

## 2023-06-21 DIAGNOSIS — D510 Vitamin B12 deficiency anemia due to intrinsic factor deficiency: Secondary | ICD-10-CM | POA: Diagnosis not present

## 2023-07-05 ENCOUNTER — Ambulatory Visit: Admitting: Psychiatry

## 2023-07-05 DIAGNOSIS — F411 Generalized anxiety disorder: Secondary | ICD-10-CM

## 2023-07-05 NOTE — Progress Notes (Signed)
 [facility] Counselor/Therapist Progress Note  Patient ID: Tracey Norris, MRN: [id number],    Date: 07/05/2023  Time Spent: 50 minutes   Treatment Type: Individual Therapy  Reported Symptoms: anxiety, anger with self, sadness, forgetfulness, frustration with herself and bowel issues continue   Mental Status Exam:  Appearance:   Casual     Behavior:  Appropriate, Sharing, and Motivated  Motor:  Normal  Speech/Language:   Clear and Coherent  Affect:  Depressed and anxious  Mood:  anxious and depressed  Thought process:  goal directed  Thought content:    Rumination  Sensory/Perceptual disturbances:    WNL  Orientation:  oriented to person, place, time/date, situation, day of week, month of year, year, and stated date of July 05, 2023  Attention:  Fair  Concentration:  Fair  Memory:  Fair, not so good, and had memory testing done within past year  Fund of knowledge:   Fair  Insight:    Fair  Judgment:   Good  Impulse Control:  Good and Fair   Risk Assessment: Danger to Self:  No Self-injurious Behavior: No Danger to Others: No Duty to Warn:no Physical Aggression / Violence:No  Access to Firearms a concern: No  Gang Involvement:No   Subjective: Patient today motivated and reports her symptoms to be anxiety, irritability at times, some memory issues, sadness that I can't do everything and not having as much contact with grandkids as they are off in different colleges. Feeling urged to go into one of those retirement homes but we're not ready. On anxiety scale of 1-10, she reports being a 4/5. Stated she needs to work on her anxious thoughts as they are bad and I try to figure out why, but I jump to fast before I think. Hard to relax. Worked with patient today on some self-calming skills to help reduce her anxiety involving slow intentional deep breathing exercises and she found it difficult and kept trying it in session today with eventual success. Also worked on  patient's depression and related thought patterns,tendency to over-stress on things out of my control, and frequently jumps to inaccurate conclusions.  Focused on each of these areas with patient and she responded saying that she is better when she is less anxious and can focus as she was able to do some in office today and is to work on this more between sessions.  She did notice that in the quietness of an office, with encouragement, she was able to experience some decrease in her anxiety.  Family supportive although 1 adult child has encouraged them to seek information about retirement centers, although patient and husband do not feel they are ready for that yet.  Patient to follow-up again at this office after seeing Dr. Huey who did some prior memory related testing on her.  Interventions: Cognitive Behavioral Therapy, Solution-Oriented/Positive Psychology, and Ego-Supportive Long-term goal: Reduce overall level, frequency, and intensity of the anxiety so that daily functioning is not impaired. Short-term goal: Increase understanding of beliefs and messages that produce the worry and anxiety. Strategies: Explore cognitive messages that mediate anxiety response and retrain in adaptive cognitions   Diagnosis:   ICD-10-CM   1. Generalized anxiety disorder  F41.1      Plan: Patient in session today and was able to actually focus a little better than normally after settling down and practicing some deep breathing exercises.  Concerned about memory issues and is anxious about upcoming appointment with Dr. Huey, who did some memory  testing with her earlier.  Patient expressing some concern about physical issues lingering and she has been in touch with her regular medical doctor about those.  Responded better today and working on some reduction in her anxiety and practicing some deep breathing exercises.  Reminded and encouraged her in her practice of positive and self affirming behaviors as noted  and discussed in sessions including: Remain in the present focusing on what she can change her control, letting others help her as needed or wanted, focusing more on the positives versus negatives or what might happen, refrain from jumping ahead and worrying about the future, practice intentional listening to others before responding, reduce overthinking over analyzing, focus on her strengths and trust the help of others, and recognize the strengths she shows as she works with goal-directed behaviors trying to move in a direction that supports her improved emotional health and overall wellbeing.  (Due to some recent medical diagnoses, there continues to be some additional uncertainty regarding the extent of patient's memory issues).  Goal review and progress/challenges noted with patient.  Next appointment within 3 to 4 weeks.   Barnie Bunde, LCSW

## 2023-07-20 ENCOUNTER — Telehealth: Payer: Self-pay | Admitting: Gastroenterology

## 2023-07-20 DIAGNOSIS — D510 Vitamin B12 deficiency anemia due to intrinsic factor deficiency: Secondary | ICD-10-CM | POA: Diagnosis not present

## 2023-07-20 NOTE — Telephone Encounter (Signed)
 Looks like she recently had colonoscopy with Dr. Nelson.  I cannot see his report. I would advise to please follow-up with him rather than starting all over again RG

## 2023-07-20 NOTE — Telephone Encounter (Signed)
 Good Afternoon Dr.Gupta   DOD PM  Patients husband called and stated wife needs a consultation due to her previous gi history with GERD and constipation. Would like a transfer of care due to not being able to connect with a doctor, patients husband stated they have tried over 4 times to connect with a doctor and had no luck. Did not state specific provider previously seen at [facility] over a month ago. Records can be found in EPIC.   Please advise for scheduling  Thank you

## 2023-07-26 DIAGNOSIS — L814 Other melanin hyperpigmentation: Secondary | ICD-10-CM | POA: Diagnosis not present

## 2023-07-26 DIAGNOSIS — L300 Nummular dermatitis: Secondary | ICD-10-CM | POA: Diagnosis not present

## 2023-07-26 DIAGNOSIS — L820 Inflamed seborrheic keratosis: Secondary | ICD-10-CM | POA: Diagnosis not present

## 2023-07-27 DIAGNOSIS — D510 Vitamin B12 deficiency anemia due to intrinsic factor deficiency: Secondary | ICD-10-CM | POA: Diagnosis not present

## 2023-07-27 DIAGNOSIS — E559 Vitamin D deficiency, unspecified: Secondary | ICD-10-CM | POA: Diagnosis not present

## 2023-07-27 DIAGNOSIS — R413 Other amnesia: Secondary | ICD-10-CM | POA: Diagnosis not present

## 2023-07-27 DIAGNOSIS — F419 Anxiety disorder, unspecified: Secondary | ICD-10-CM | POA: Diagnosis not present

## 2023-07-27 DIAGNOSIS — C50912 Malignant neoplasm of unspecified site of left female breast: Secondary | ICD-10-CM | POA: Diagnosis not present

## 2023-07-27 DIAGNOSIS — E785 Hyperlipidemia, unspecified: Secondary | ICD-10-CM | POA: Diagnosis not present

## 2023-07-27 DIAGNOSIS — I10 Essential (primary) hypertension: Secondary | ICD-10-CM | POA: Diagnosis not present

## 2023-08-02 NOTE — Telephone Encounter (Signed)
Patient's husband was advised. 

## 2023-08-03 ENCOUNTER — Ambulatory Visit: Admitting: Psychiatry

## 2023-08-03 DIAGNOSIS — Z961 Presence of intraocular lens: Secondary | ICD-10-CM | POA: Diagnosis not present

## 2023-08-03 DIAGNOSIS — H5203 Hypermetropia, bilateral: Secondary | ICD-10-CM | POA: Diagnosis not present

## 2023-08-03 DIAGNOSIS — H04123 Dry eye syndrome of bilateral lacrimal glands: Secondary | ICD-10-CM | POA: Diagnosis not present

## 2023-08-03 DIAGNOSIS — H52223 Regular astigmatism, bilateral: Secondary | ICD-10-CM | POA: Diagnosis not present

## 2023-08-03 DIAGNOSIS — H43813 Vitreous degeneration, bilateral: Secondary | ICD-10-CM | POA: Diagnosis not present

## 2023-08-08 DIAGNOSIS — F02B0 Dementia in other diseases classified elsewhere, moderate, without behavioral disturbance, psychotic disturbance, mood disturbance, and anxiety: Secondary | ICD-10-CM | POA: Diagnosis not present

## 2023-08-08 DIAGNOSIS — G301 Alzheimer's disease with late onset: Secondary | ICD-10-CM | POA: Diagnosis not present

## 2023-08-09 ENCOUNTER — Ambulatory Visit: Admitting: Psychiatry

## 2023-08-09 NOTE — Progress Notes (Unsigned)
      [facility] Counselor/Therapist Progress Note  Patient ID: Tracey Norris, MRN: [id number],    Date: 08/09/2023  Time Spent: ***   Treatment Type: {[facility] AMB THERAPY [phone]}  Reported Symptoms: ***   Mental Status Exam:  Appearance:   {PSY:22683}     Behavior:  {PSY:[id number]}  Motor:  {PSY:22302}  Speech/Language:   {PSY:22685}  Affect:  {PSY:22687}  Mood:  {PSY:31886}  Thought process:  {PSY:31888}  Thought content:    {PSY:[id number]}  Sensory/Perceptual disturbances:    {[phone]}  Orientation:  {PSY:30297}  Attention:  {PSY:22877}  Concentration:  {PSY:410-115-2311}  Memory:  {PSY:318 382 3487}  Fund of knowledge:   {PSY:410-115-2311}  Insight:    {PSY:410-115-2311}  Judgment:   {PSY:410-115-2311}  Impulse Control:  {PSY:410-115-2311}   Risk Assessment: Danger to Self:  {PSY:22692} Self-injurious Behavior: {PSY:22692} Danger to Others: {PSY:22692} Duty to Warn:{PSY:311194} Physical Aggression / Violence:{PSY:21197} Access to Firearms a concern: {PSY:21197} Gang Involvement:{PSY:21197}  Subjective: ***     Interventions: {PSY:[id number]}  Diagnosis:No diagnosis found.   Plan: ***       Barnie Bunde, LCSW

## 2023-08-17 DIAGNOSIS — D510 Vitamin B12 deficiency anemia due to intrinsic factor deficiency: Secondary | ICD-10-CM | POA: Diagnosis not present

## 2023-08-18 DIAGNOSIS — Z1231 Encounter for screening mammogram for malignant neoplasm of breast: Secondary | ICD-10-CM | POA: Diagnosis not present

## 2023-09-06 ENCOUNTER — Ambulatory Visit (INDEPENDENT_AMBULATORY_CARE_PROVIDER_SITE_OTHER): Admitting: Psychiatry

## 2023-09-06 DIAGNOSIS — F411 Generalized anxiety disorder: Secondary | ICD-10-CM

## 2023-09-06 NOTE — Progress Notes (Signed)
 [facility] Counselor/Therapist Progress Note  Patient ID: Tracey Norris, MRN: [id number],    Date: 09/06/2023  Time Spent: 53 minutes   Treatment Type: Individual Therapy  Reported Symptoms:  anxiety, get mad at myself,  some depression at times, forgetfulness, frustration with herself, bowel issues continue and patient in contact with her Dr within another hospital system    Mental Status Exam:  Appearance:   Casual     Behavior:  Sharing and some forgetting and confusion about events and other things  Motor:  Normal  Speech/Language:   Clear and Coherent  Affect:  Anxious, some depression  Mood:  anxious and depressed  Thought process:  Easily  disturbed or confused at times  Thought content:    Rumination  Sensory/Perceptual disturbances:    Not impaired  Orientation:  Gave date as being Sept. 1, 2025  Attention:  Fair  Concentration:  Fair  Memory:  Some cognitive issues being evaluated  Fund of knowledge:   Fair  Insight:    Fair  Judgment:   Good and Fair  Impulse Control:  Good and Fair   Risk Assessment: Danger to Self:  No Self-injurious Behavior: No Danger to Others: No Duty to Warn:no Physical Aggression / Violence:No  Access to Firearms a concern: No  Gang Involvement:No   Subjective:   Patient working further today on her symptoms of anxiety, irritability at times, sadness that I cannot do everything and not having as much contact with grandkids as they are spread out in different colleges, and some memory issues. Dr. Jama Bearded of [facility] is following patient for her issues with memory. Has bad reaction to 1 med and they took her off of it. Difficulty remembering some of the details but patient was more talkative today about her experiences thus far with her anxiety and memory issues particularly.  She feels, and it does sound like, that her doctors are trying to get a better handle on her long-term anxiety before adding  additional diagnoses.  They do acknowledge some memory issues that are present.  Patient talked very openly today.  Her anxiety remains however, when she was talking in session, she did seem a little less anxious than she has on some prior occasions, although the anxiety was still very present.  She is going to be seeing a couple different doctors in the coming weeks and we agreed to wait and schedule her next appointment after those appointments as we may get some good insight on her situation more once she has had those evaluations by new doctors.  Encouraged patient to continue working with some strategies we have spoken about in sessions to help her manage her anxiety, and also to practice some of the previously mentioned self calming exercises, in addition to some walking, and in being around other people that care about her.  Today upon leaving, she stated that on an anxiety scale of 1-10, she is a 4/5 which is definitely lower than when I last saw her.  Admits that is hard to relax and was not very successful with deep breathing exercises and other physical strategies to help reduce anxiety.  Even with her anxiety, she seemed a little less on edge today in some ways, and at least not a state of just being in constant anxiety.  Her family remains quite supportive including adult children and her husband.  She is to see her new doctor in a few weeks and then we plan to  follow-up soon after that appointment.  Patient knows she or husband can call in the meantime if needed.   Interventions: Solution-Oriented/Positive Psychology and Ego-Supportive Long-term goal: Reduce overall level, frequency, and intensity of the anxiety so that daily functioning is not impaired. Short-term goal: Increase understanding of beliefs and messages that produce the worry and anxiety. Strategies: Explore cognitive messages that mediate anxiety response and retrain in adaptive cognitions   Diagnosis:   ICD-10-CM   1.  Generalized anxiety disorder  F41.1      Plan:   Patient alert and actively participating in session today quite well.  Does continue to have some anxiety and some depression however not quite as acute today.  At some point in the session I noticed that she was noticeably less anxious but it was not consistent throughout the session.  Talked openly and shared details of a recent appointment with Dr. Huey and also referenced her upcoming appointment with a new doctor regarding a different issue.  (Not all details included in this note due to patient privacy needs.) Reminded patient to remain in the present focusing on what she can change or control, letting others help her as needed or wanted, focusing more on the positives versus negatives for what might happen, refrain from jumping ahead and worrying about the future, practice intentional listening to others before responding, reduce overthinking and over analyzing, focus on her strengths and trust the help of others, and realize the strengths she shows as she works with goal-directed behaviors trying to move in a direction that supports her improved emotional health and overall wellbeing.  Per report from patient and husband today, due to some medical diagnosis they are exploring particularly with her anxiety and memory, there continues to be some uncertainty regarding the extent of patient's memory issues and some potential causes that may emerge from further evaluation.  Goal review and progress/challenges noted with patient.  Next appointment within 3 to 4 weeks.   Barnie Bunde, LCSW

## 2023-09-20 DIAGNOSIS — D510 Vitamin B12 deficiency anemia due to intrinsic factor deficiency: Secondary | ICD-10-CM | POA: Diagnosis not present

## 2023-09-26 DIAGNOSIS — D510 Vitamin B12 deficiency anemia due to intrinsic factor deficiency: Secondary | ICD-10-CM | POA: Diagnosis not present

## 2023-09-26 DIAGNOSIS — F419 Anxiety disorder, unspecified: Secondary | ICD-10-CM | POA: Diagnosis not present

## 2023-09-26 DIAGNOSIS — G47419 Narcolepsy without cataplexy: Secondary | ICD-10-CM | POA: Diagnosis not present

## 2023-09-26 DIAGNOSIS — R42 Dizziness and giddiness: Secondary | ICD-10-CM | POA: Diagnosis not present

## 2023-09-26 DIAGNOSIS — R531 Weakness: Secondary | ICD-10-CM | POA: Diagnosis not present

## 2023-10-11 DIAGNOSIS — R194 Change in bowel habit: Secondary | ICD-10-CM | POA: Diagnosis not present

## 2023-10-16 DIAGNOSIS — R413 Other amnesia: Secondary | ICD-10-CM | POA: Diagnosis not present

## 2023-10-16 DIAGNOSIS — E559 Vitamin D deficiency, unspecified: Secondary | ICD-10-CM | POA: Diagnosis not present

## 2023-10-16 DIAGNOSIS — I10 Essential (primary) hypertension: Secondary | ICD-10-CM | POA: Diagnosis not present

## 2023-10-16 DIAGNOSIS — C50912 Malignant neoplasm of unspecified site of left female breast: Secondary | ICD-10-CM | POA: Diagnosis not present

## 2023-10-16 DIAGNOSIS — M8589 Other specified disorders of bone density and structure, multiple sites: Secondary | ICD-10-CM | POA: Diagnosis not present

## 2023-10-16 DIAGNOSIS — F419 Anxiety disorder, unspecified: Secondary | ICD-10-CM | POA: Diagnosis not present

## 2023-10-16 DIAGNOSIS — Z23 Encounter for immunization: Secondary | ICD-10-CM | POA: Diagnosis not present

## 2023-10-16 DIAGNOSIS — E785 Hyperlipidemia, unspecified: Secondary | ICD-10-CM | POA: Diagnosis not present

## 2023-10-16 DIAGNOSIS — D510 Vitamin B12 deficiency anemia due to intrinsic factor deficiency: Secondary | ICD-10-CM | POA: Diagnosis not present

## 2023-10-17 ENCOUNTER — Ambulatory Visit: Admitting: Psychiatry

## 2023-10-25 DIAGNOSIS — D510 Vitamin B12 deficiency anemia due to intrinsic factor deficiency: Secondary | ICD-10-CM | POA: Diagnosis not present

## 2023-11-02 ENCOUNTER — Ambulatory Visit (INDEPENDENT_AMBULATORY_CARE_PROVIDER_SITE_OTHER): Admitting: Psychiatry

## 2023-11-02 DIAGNOSIS — F411 Generalized anxiety disorder: Secondary | ICD-10-CM | POA: Diagnosis not present

## 2023-11-02 NOTE — Progress Notes (Signed)
 [facility] Counselor/Therapist Progress Note  Patient ID: Tracey Norris, MRN: [id number],    Date: 11/02/2023  Time Spent: 55 minutes   Treatment Type: Individual Therapy  Reported Symptoms: anxiety, less anger at self but do sometimes have a short fuse, depression, forgetfulness, frustrations, bowel issues and having colonoscopy soon;  memory issues noted by Tracey Norris   Mental Status Exam:  Appearance:   Casual     Behavior:  Appropriate, Sharing, and Motivated  Motor:  Normal  Speech/Language:   Clear and Coherent  Affect:  Depressed and anxious  Mood:  anxious and depressed  Thought process:  Jumps around with her thoughts  Thought content:    Rumination  Sensory/Perceptual disturbances:    WNL  Orientation:  Patient oriented to month October, don't know date, and  year is uh........2025  Attention:  Fair  Concentration:  Fair and Poor  Memory:  Memory issues currently being evaluated and treated  Fund of knowledge:   Good and Fair  Insight:    Fair  Judgment:   Good and Fair  Impulse Control:  Good and Fair   Risk Assessment: Danger to Self:  No Self-injurious Behavior: No Danger to Others: No Duty to Warn:no Physical Aggression / Violence:No  Access to Firearms a concern: No  Gang Involvement:No   Subjective:   Patient today reporting anxiety, irritability at times, depression, frustrations, forgetfulness, and a short fuse at times.  My anxiety is my worst symptom.  Talking about her grandkids, 6 of them and they are in college, misses seeing them as often but is to see them at upcoming holidays. Saw Tracey Norris of [facility] re: memory concerns. Appearing more anxious than depressed today. Talking almost non-stop, with some forgetting and losing track, but did well with some reminders from therapist. Today, focusing primarily on her upcoming colonoscopy within 2 days and patient's anxiety and assumptions about this procedure.  Talked through more of her anxiety which may have helped some in the moment but her anxiety does seem to recycle. Very difficult for patient to work on strategies and difficult for her to believe there may be a fix for her bowel issues and her anxiety. Encouraged patient in working with strategies especially regarding her anxiety, and to be practicing some calming exercises as noted again in session today, while also walking more, and try to spend time being around others who love and care about her.  Did review some exercises that may help her with her anxiety including some deep breathing exercises although has stated that nothing really helps it, and does find it hard to try a strategy.  Rates  herself today as a 7 on  1-10 anxiety scale, which is higher than last session but not surprising due to recent ongoing bowel issues as she has described.  Family remains supportive.   Interventions: Cognitive Behavioral Therapy, Solution-Oriented/Positive Psychology, and Ego-Supportive Long-term goal: Reduce overall level, frequency, and intensity of the anxiety so that daily functioning is not impaired. Short-term goal: Increase understanding of beliefs and messages that produce the worry and anxiety. Strategies: Explore cognitive messages that mediate anxiety response and retrain in adaptive cognitions  Diagnosis:   ICD-10-CM   1. Generalized anxiety disorder  F41.1      Plan:   Patient today continues to have ongoing anxiety symptoms along with her physical symptoms for which she is having a colonoscopy within the next few days.  Patient states she is hoping they will  find something in the colonoscopy that can be fixed so they will not think it is just my anxiety.  Alert and actively participating in session today.  Very hyperfocused on her upcoming colonoscopy.  Did engage in conversation some in reference to memory concerns as noted in her chart.  (Not all details included in this note due to  patient privacy needs).  Plan to see patient again within approximately 4 to 5 weeks.  Encouraged patient to try remaining in the present focusing on the things she can change or control, let others help her as needed or wanted, focus more on the positives versus negatives, refrain from jumping ahead and worrying about the future, practice intentional listening to others before responding, reduce overthinking and overanalyzing, focus on her strengths and trust the help of others, and recognize the strength when she does try to work with goal-directed behaviors to move in a direction that supports her improved emotional health and overall wellbeing.  Due to some memory challenges, that is also a factor in patient working on the above-stated strategies.  Goal review and progress/challenges noted with patient.   Next appt within 4-5 weeks.   Tracey Bunde, LCSW

## 2023-11-08 DIAGNOSIS — Z9012 Acquired absence of left breast and nipple: Secondary | ICD-10-CM | POA: Diagnosis not present

## 2023-11-08 DIAGNOSIS — F0390 Unspecified dementia without behavioral disturbance: Secondary | ICD-10-CM | POA: Diagnosis not present

## 2023-11-08 DIAGNOSIS — I10 Essential (primary) hypertension: Secondary | ICD-10-CM | POA: Diagnosis not present

## 2023-11-08 DIAGNOSIS — Z853 Personal history of malignant neoplasm of breast: Secondary | ICD-10-CM | POA: Diagnosis not present

## 2023-11-08 DIAGNOSIS — Z881 Allergy status to other antibiotic agents status: Secondary | ICD-10-CM | POA: Diagnosis not present

## 2023-11-08 DIAGNOSIS — Z7902 Long term (current) use of antithrombotics/antiplatelets: Secondary | ICD-10-CM | POA: Diagnosis not present

## 2023-11-08 DIAGNOSIS — K219 Gastro-esophageal reflux disease without esophagitis: Secondary | ICD-10-CM | POA: Diagnosis not present

## 2023-11-08 DIAGNOSIS — D126 Benign neoplasm of colon, unspecified: Secondary | ICD-10-CM | POA: Diagnosis not present

## 2023-11-08 DIAGNOSIS — K635 Polyp of colon: Secondary | ICD-10-CM | POA: Diagnosis not present

## 2023-11-08 DIAGNOSIS — Z79899 Other long term (current) drug therapy: Secondary | ICD-10-CM | POA: Diagnosis not present

## 2023-11-08 DIAGNOSIS — R194 Change in bowel habit: Secondary | ICD-10-CM | POA: Diagnosis not present

## 2023-11-08 DIAGNOSIS — E785 Hyperlipidemia, unspecified: Secondary | ICD-10-CM | POA: Diagnosis not present

## 2023-11-08 DIAGNOSIS — Z8673 Personal history of transient ischemic attack (TIA), and cerebral infarction without residual deficits: Secondary | ICD-10-CM | POA: Diagnosis not present

## 2023-11-17 DIAGNOSIS — M8589 Other specified disorders of bone density and structure, multiple sites: Secondary | ICD-10-CM | POA: Diagnosis not present

## 2023-11-20 DIAGNOSIS — M8589 Other specified disorders of bone density and structure, multiple sites: Secondary | ICD-10-CM | POA: Diagnosis not present

## 2023-11-29 DIAGNOSIS — D510 Vitamin B12 deficiency anemia due to intrinsic factor deficiency: Secondary | ICD-10-CM | POA: Diagnosis not present

## 2023-12-14 ENCOUNTER — Ambulatory Visit: Admitting: Psychiatry
